# Patient Record
Sex: Male | Born: 2000 | Race: Black or African American | Hispanic: No | Marital: Single | Smoking: Current every day smoker
Health system: Southern US, Community
[De-identification: ages and names within clinical notes are randomized; demographics above are authoritative.]

## PROBLEM LIST (undated history)

## (undated) DIAGNOSIS — F909 Attention-deficit hyperactivity disorder, unspecified type: Secondary | ICD-10-CM

## (undated) HISTORY — PX: ROOT CANAL: SHX2363

---
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
## 2018-01-11 ENCOUNTER — Emergency Department (HOSPITAL_COMMUNITY): Payer: Medicaid Other

## 2018-01-11 ENCOUNTER — Emergency Department (HOSPITAL_COMMUNITY)
Admission: EM | Admit: 2018-01-11 | Discharge: 2018-01-12 | Disposition: A | Discharge status: Home / Self Care | Payer: Medicaid Other | Attending: Emergency Medicine | Admitting: Emergency Medicine

## 2018-01-11 ENCOUNTER — Encounter (HOSPITAL_COMMUNITY): Payer: Self-pay | Admitting: Emergency Medicine

## 2018-01-11 DIAGNOSIS — W25XXXA Contact with sharp glass, initial encounter: Secondary | ICD-10-CM | POA: Diagnosis not present

## 2018-01-11 DIAGNOSIS — S6992XA Unspecified injury of left wrist, hand and finger(s), initial encounter: Secondary | ICD-10-CM

## 2018-01-11 DIAGNOSIS — F17210 Nicotine dependence, cigarettes, uncomplicated: Secondary | ICD-10-CM | POA: Diagnosis not present

## 2018-01-11 DIAGNOSIS — S61213A Laceration without foreign body of left middle finger without damage to nail, initial encounter: Secondary | ICD-10-CM | POA: Insufficient documentation

## 2018-01-11 DIAGNOSIS — Y9389 Activity, other specified: Secondary | ICD-10-CM | POA: Insufficient documentation

## 2018-01-11 DIAGNOSIS — S80211A Abrasion, right knee, initial encounter: Secondary | ICD-10-CM | POA: Diagnosis not present

## 2018-01-11 DIAGNOSIS — Y92009 Unspecified place in unspecified non-institutional (private) residence as the place of occurrence of the external cause: Secondary | ICD-10-CM | POA: Insufficient documentation

## 2018-01-11 DIAGNOSIS — Y999 Unspecified external cause status: Secondary | ICD-10-CM | POA: Insufficient documentation

## 2018-01-11 NOTE — ED Triage Notes (Signed)
Pt reports punching some glass prior to arrival, reports abrasion to R knee and laceration to L posterior hand. Reports TDAP up to date last year.

## 2018-01-11 NOTE — ED Notes (Signed)
Unable to perform wound care as pt is not in room.

## 2018-01-11 NOTE — ED Provider Notes (Signed)
[facility] EMERGENCY DEPARTMENT Provider Note   CSN: [id number] Arrival date & time: 01/11/18  2150     History   Chief Complaint Chief Complaint  Patient presents with  . Finger Injury  . Leg Pain    HPI Ronnie Jenkins is a 17 y.o. male.  Patient states he punched a pane of glass with his left hand. He has a small laceration to the middle finger. Tetanus up to date. Abrasion to right knee. No knee joint pain.   Hand Injury   The incident occurred 1 to 2 hours ago. The incident occurred at home. The injury mechanism was a direct blow. The pain is mild. Possible foreign bodies include glass.    Past Medical History:  Diagnosis Date  . ADHD     There are no active problems to display for this patient.   Past Surgical History:  Procedure Laterality Date  . ROOT CANAL          Home Medications    Prior to Admission medications   Medication Sig Start Date End Date Taking? Authorizing Provider  HYDROcodone-acetaminophen (NORCO) 7.5-325 MG tablet Take 1 tablet by mouth every 6 (six) hours as needed for moderate pain. 10/31/17   Eustace Moore, MD    Family History History reviewed. No pertinent family history.  Social History Social History   Tobacco Use  . Smoking status: Current Every Day Smoker    Packs/day: 0.25    Types: Cigarettes  . Smokeless tobacco: Never Used  Substance Use Topics  . Alcohol use: Never    Frequency: Never  . Drug use: Not Currently     Allergies   Patient has no known allergies.   Review of Systems Review of Systems  Skin: Positive for wound.  All other systems reviewed and are negative.    Physical Exam Updated Vital Signs BP 134/82 (BP Location: Right Arm)   Pulse 91   Temp 99.3 F (37.4 C) (Oral)   Resp 18   Ht 6\' 1"  (1.854 m)   Wt 90.7 kg   SpO2 98%   BMI 26.39 kg/m   Physical Exam  Constitutional: He is oriented to person, place, and time. He appears well-developed and  well-nourished.  HENT:  Head: Atraumatic.  Eyes: EOM are normal.  Neck: Neck supple.  Cardiovascular: Normal rate and regular rhythm.  Pulmonary/Chest: Effort normal and breath sounds normal.  Abdominal: Soft. Bowel sounds are normal.  Musculoskeletal: Normal range of motion. He exhibits no deformity.  ? Abrasion/superficial laceration to PIP left middle finger and PIP of left little finger, obscured by blood.  Neurological: He is alert and oriented to person, place, and time.  Skin:  Abrasion right knee  Nursing note and vitals reviewed.    ED Treatments / Results  Labs (all labs ordered are listed, but only abnormal results are displayed) Labs Reviewed - No data to display  EKG None  Radiology Dg Hand Complete Left  Result Date: 01/11/2018 CLINICAL DATA:  Punched glass.  Hand laceration. EXAM: LEFT HAND - COMPLETE 3+ VIEW COMPARISON:  None. FINDINGS: There is no evidence of fracture or dislocation. There is no evidence of arthropathy or other focal bone abnormality. Bandage overlying dorsal hand. Tiny radiopaque foreign body projecting within the lateral soft tissues fifth middle phalanx. Mild dorsal finger soft tissue swelling. IMPRESSION: Tiny foreign body, possibly glass within fifth middle phalanx soft tissues. No acute osseous process. Electronically Signed   By: Michel Santee.D.  On: 01/11/2018 22:29    Procedures Procedures (including critical care time)  Medications Ordered in ED Medications - No data to display   Initial Impression / Assessment and Plan / ED Course  I have reviewed the triage vital signs and the nursing notes.  Pertinent labs & imaging results that were available during my care of the patient were reviewed by me and considered in my medical decision making (see chart for details).     Patient left prior to cleansing of wounds and assessment for need for wound closure. Patient did not inform staff prior to leaving.  Final Clinical  Impressions(s) / ED Diagnoses   Final diagnoses:  Abrasion, right knee, initial encounter  Injury of left hand, initial encounter    ED Discharge Orders    None       Felicie Morn, NP 01/12/18 0043    Virgina Norfolk, DO 01/12/18 1610

## 2018-01-12 NOTE — ED Notes (Signed)
Pt told staff that he was going out to talk to family, pt did not come back to room

## 2018-07-13 NOTE — Progress Notes (Signed)
COVID Hotel Screening performed. Temperature, PHQ-9, and need for medical care and medications assessed. No additional needs at this time.  Aby Gessel RN MSN 

## 2018-07-20 NOTE — Progress Notes (Signed)
COVID Hotel Screening performed. Temperature, PHQ-9, and need for medical care and medications assessed. No additional needs assessed at this time.  Sarahjane Matherly RN MSN 

## 2018-07-27 NOTE — Progress Notes (Signed)
COVID Hotel Screening performed. Temperature, PHQ-9, and need for medical care and medications assessed. No additional needs assessed at this time.  Malikhi Ogan RN MSN 

## 2018-08-03 NOTE — Progress Notes (Signed)
COVID Hotel Screening performed. Temperature, PHQ-9, and need for medical care and medications assessed. No additional needs assessed at this time.  Rowland Ericsson RN MSN 

## 2018-08-10 NOTE — Progress Notes (Signed)
COVID Hotel Screening performed. Temperature, PHQ-9, and need for medical care and medications assessed. No additional needs assessed at this time.  Daijha Leggio RN MSN 

## 2018-08-18 ENCOUNTER — Other Ambulatory Visit (HOSPITAL_COMMUNITY): Payer: Self-pay

## 2018-08-18 DIAGNOSIS — Z20822 Contact with and (suspected) exposure to covid-19: Secondary | ICD-10-CM

## 2018-08-19 ENCOUNTER — Other Ambulatory Visit: Payer: Self-pay

## 2018-08-19 DIAGNOSIS — Z20822 Contact with and (suspected) exposure to covid-19: Secondary | ICD-10-CM

## 2018-08-22 LAB — NOVEL CORONAVIRUS, NAA: SARS-CoV-2, NAA: NOT DETECTED

## 2018-08-22 NOTE — Progress Notes (Signed)
COVID Hotel Screening performed. COVID screening, temperature, and need for medical care and medications assessed. Patient agreed to the COVID-19 testing. No additional needs assessed at this time.  Dariona Postma RN MSN 

## 2018-09-01 NOTE — Progress Notes (Signed)
COVID Hotel Screening performed. COVID screening, temperature, PHQ-9, and need for medical care and medications assessed. No additional needs assessed at this time.  Frances Joynt RN MSN 

## 2018-09-16 NOTE — Progress Notes (Signed)
COVID Hotel Screening performed. Temperature, PHQ-9, and need for medical care and medications assessed. No additional needs assessed at this time.  Evva Din  MSN, RN 

## 2018-09-22 ENCOUNTER — Other Ambulatory Visit: Payer: Self-pay | Admitting: *Deleted

## 2018-09-22 DIAGNOSIS — Z20822 Contact with and (suspected) exposure to covid-19: Secondary | ICD-10-CM

## 2018-09-29 LAB — NOVEL CORONAVIRUS, NAA: SARS-CoV-2, NAA: NOT DETECTED

## 2018-11-02 NOTE — Progress Notes (Signed)
COVID-19 Screening performed. Temperature, PHQ-9, and need for medical care and medications assessed. No additional needs assessed at this time.  Marianne Golightly MSN, RN 

## 2018-11-06 NOTE — Progress Notes (Signed)
COVID-19 Screening performed. Temperature, PHQ-9, and need for medical care and medications assessed. No additional needs assessed at this time.  Ailanie Ruttan MSN, RN 

## 2019-06-19 ENCOUNTER — Emergency Department (HOSPITAL_COMMUNITY)
Admission: EM | Admit: 2019-06-19 | Discharge: 2019-06-19 | Disposition: A | Discharge status: Home / Self Care | Payer: Medicaid Other | Attending: Emergency Medicine | Admitting: Emergency Medicine

## 2019-06-19 ENCOUNTER — Encounter (HOSPITAL_COMMUNITY): Payer: Self-pay | Admitting: Emergency Medicine

## 2019-06-19 ENCOUNTER — Other Ambulatory Visit: Payer: Self-pay

## 2019-06-19 DIAGNOSIS — Z5321 Procedure and treatment not carried out due to patient leaving prior to being seen by health care provider: Secondary | ICD-10-CM | POA: Insufficient documentation

## 2019-06-19 DIAGNOSIS — M545 Low back pain: Secondary | ICD-10-CM | POA: Diagnosis not present

## 2019-06-19 MED ORDER — OXYCODONE-ACETAMINOPHEN 5-325 MG PO TABS
1.0000 | ORAL_TABLET | ORAL | Status: DC | PRN
  Administered 2019-06-19: 1 via ORAL
  Filled 2019-06-19: qty 1
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
## 2019-06-19 NOTE — ED Notes (Signed)
Pt ambulatory in the waiting room without assistance.

## 2019-06-22 ENCOUNTER — Encounter (HOSPITAL_COMMUNITY): Payer: Self-pay | Admitting: *Deleted

## 2019-06-22 ENCOUNTER — Other Ambulatory Visit: Payer: Self-pay

## 2019-06-22 DIAGNOSIS — Y9389 Activity, other specified: Secondary | ICD-10-CM | POA: Insufficient documentation

## 2019-06-22 DIAGNOSIS — Y998 Other external cause status: Secondary | ICD-10-CM | POA: Insufficient documentation

## 2019-06-22 DIAGNOSIS — M25551 Pain in right hip: Secondary | ICD-10-CM | POA: Diagnosis not present

## 2019-06-22 DIAGNOSIS — R200 Anesthesia of skin: Secondary | ICD-10-CM | POA: Diagnosis not present

## 2019-06-22 DIAGNOSIS — S39012A Strain of muscle, fascia and tendon of lower back, initial encounter: Secondary | ICD-10-CM | POA: Insufficient documentation

## 2019-06-22 DIAGNOSIS — S3992XA Unspecified injury of lower back, initial encounter: Secondary | ICD-10-CM | POA: Diagnosis present

## 2019-06-22 DIAGNOSIS — F17210 Nicotine dependence, cigarettes, uncomplicated: Secondary | ICD-10-CM | POA: Insufficient documentation

## 2019-06-22 DIAGNOSIS — Y92410 Unspecified street and highway as the place of occurrence of the external cause: Secondary | ICD-10-CM | POA: Insufficient documentation
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
## 2019-06-23 ENCOUNTER — Emergency Department (HOSPITAL_COMMUNITY)
Admission: EM | Admit: 2019-06-23 | Discharge: 2019-06-23 | Disposition: A | Discharge status: Home / Self Care | Payer: No Typology Code available for payment source | Attending: Emergency Medicine | Admitting: Emergency Medicine

## 2019-06-23 ENCOUNTER — Emergency Department (HOSPITAL_COMMUNITY): Payer: No Typology Code available for payment source

## 2019-06-23 DIAGNOSIS — S39012A Strain of muscle, fascia and tendon of lower back, initial encounter: Secondary | ICD-10-CM

## 2019-06-23 LAB — URINALYSIS, ROUTINE W REFLEX MICROSCOPIC
Bilirubin Urine: NEGATIVE
Glucose, UA: NEGATIVE mg/dL
Hgb urine dipstick: NEGATIVE
Ketones, ur: NEGATIVE mg/dL
Leukocytes,Ua: NEGATIVE
Nitrite: NEGATIVE
Protein, ur: NEGATIVE mg/dL
Specific Gravity, Urine: 1.03 (ref 1.005–1.030)
pH: 6 (ref 5.0–8.0)

## 2019-06-23 MED ORDER — IBUPROFEN 400 MG PO TABS
400.0000 mg | ORAL_TABLET | Freq: Once | ORAL | Status: AC
  Administered 2019-06-23: 01:00:00 400 mg via ORAL
  Filled 2019-06-23: qty 1

## 2019-06-23 MED ORDER — METHYLPREDNISOLONE 4 MG PO TBPK: ORAL_TABLET | ORAL | 0 refills | Status: DC

## 2019-06-23 MED ORDER — METHOCARBAMOL 500 MG PO TABS: 500.0000 mg | ORAL_TABLET | Freq: Three times a day (TID) | ORAL | 0 refills | Status: DC | PRN
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
## 2019-06-23 NOTE — Discharge Instructions (Signed)
Your x-rays are negative.  Take the anti-inflammatories as prescribed.  Follow-up with your doctor.  Return to the ED with worsening pain, weakness, numbness, bowel or bladder incontinence, any other concerns.

## 2019-09-14 DIAGNOSIS — Z419 Encounter for procedure for purposes other than remedying health state, unspecified: Secondary | ICD-10-CM | POA: Diagnosis not present

## 2019-10-15 DIAGNOSIS — Z419 Encounter for procedure for purposes other than remedying health state, unspecified: Secondary | ICD-10-CM | POA: Diagnosis not present

## 2019-11-15 DIAGNOSIS — Z419 Encounter for procedure for purposes other than remedying health state, unspecified: Secondary | ICD-10-CM | POA: Diagnosis not present

## 2019-12-15 DIAGNOSIS — Z419 Encounter for procedure for purposes other than remedying health state, unspecified: Secondary | ICD-10-CM | POA: Diagnosis not present

## 2020-01-15 DIAGNOSIS — Z419 Encounter for procedure for purposes other than remedying health state, unspecified: Secondary | ICD-10-CM | POA: Diagnosis not present

## 2020-02-14 DIAGNOSIS — Z419 Encounter for procedure for purposes other than remedying health state, unspecified: Secondary | ICD-10-CM | POA: Diagnosis not present

## 2020-03-16 DIAGNOSIS — Z419 Encounter for procedure for purposes other than remedying health state, unspecified: Secondary | ICD-10-CM | POA: Diagnosis not present

## 2020-04-01 ENCOUNTER — Other Ambulatory Visit: Payer: Self-pay

## 2020-04-01 ENCOUNTER — Encounter (HOSPITAL_COMMUNITY): Payer: Self-pay | Admitting: *Deleted

## 2020-04-01 ENCOUNTER — Emergency Department (HOSPITAL_COMMUNITY)
Admission: EM | Admit: 2020-04-01 | Discharge: 2020-04-02 | Disposition: A | Discharge status: Home / Self Care | Payer: Medicaid Other | Attending: Emergency Medicine | Admitting: Emergency Medicine

## 2020-04-01 DIAGNOSIS — Z5321 Procedure and treatment not carried out due to patient leaving prior to being seen by health care provider: Secondary | ICD-10-CM | POA: Diagnosis not present

## 2020-04-01 DIAGNOSIS — R45851 Suicidal ideations: Secondary | ICD-10-CM | POA: Diagnosis not present

## 2020-04-01 DIAGNOSIS — Z046 Encounter for general psychiatric examination, requested by authority: Secondary | ICD-10-CM | POA: Insufficient documentation

## 2020-04-01 LAB — CBC
HCT: 44.7 % (ref 39.0–52.0)
Hemoglobin: 15.3 g/dL (ref 13.0–17.0)
MCH: 29.3 pg (ref 26.0–34.0)
MCHC: 34.2 g/dL (ref 30.0–36.0)
MCV: 85.6 fL (ref 80.0–100.0)
Platelets: 232 10*3/uL (ref 150–400)
RBC: 5.22 MIL/uL (ref 4.22–5.81)
RDW: 12.1 % (ref 11.5–15.5)
WBC: 8.5 10*3/uL (ref 4.0–10.5)
nRBC: 0 % (ref 0.0–0.2)

## 2020-04-01 LAB — COMPREHENSIVE METABOLIC PANEL
ALT: 12 U/L (ref 0–44)
AST: 23 U/L (ref 15–41)
Albumin: 4.4 g/dL (ref 3.5–5.0)
Alkaline Phosphatase: 58 U/L (ref 38–126)
Anion gap: 11 (ref 5–15)
BUN: 11 mg/dL (ref 6–20)
CO2: 23 mmol/L (ref 22–32)
Calcium: 9.5 mg/dL (ref 8.9–10.3)
Chloride: 103 mmol/L (ref 98–111)
Creatinine, Ser: 1.12 mg/dL (ref 0.61–1.24)
GFR, Estimated: >60 mL/min (ref >60)
Glucose, Bld: 98 mg/dL (ref 70–99)
Potassium: 3.6 mmol/L (ref 3.5–5.1)
Sodium: 137 mmol/L (ref 135–145)
Total Bilirubin: 1.3 mg/dL — ABNORMAL HIGH (ref 0.3–1.2)
Total Protein: 7.8 g/dL (ref 6.5–8.1)

## 2020-04-01 LAB — RAPID URINE DRUG SCREEN, HOSP PERFORMED
Amphetamines: NOT DETECTED
Barbiturates: NOT DETECTED
Benzodiazepines: NOT DETECTED
Cocaine: NOT DETECTED
Opiates: NOT DETECTED
Tetrahydrocannabinol: POSITIVE — AB

## 2020-04-01 LAB — ACETAMINOPHEN LEVEL: Acetaminophen (Tylenol), Serum: <10 ug/mL — ABNORMAL LOW (ref 10–30)

## 2020-04-01 LAB — ETHANOL: Alcohol, Ethyl (B): <10 mg/dL (ref <10)

## 2020-04-01 LAB — SALICYLATE LEVEL: Salicylate Lvl: <7 mg/dL — ABNORMAL LOW (ref 7.0–30.0)

## 2020-04-01 NOTE — ED Triage Notes (Signed)
Pt arrived by [employer] with ivc papers. Family reported that pt was going through breakup, he was upset and trashed the house and expressed thoughts of suicide. Is calm and cooperative on arrival.

## 2020-04-01 NOTE — ED Notes (Signed)
Pt had several phone calls while in triage, pt became agitated when told to get off the phone and no more phone calls, pt roomed to hallway, pt apparently grabbed belongings and fled out the door while triage staff was busy with other patients. Staff went to check if pt had been brought back to hall bed and realized pt had left. [employer] was sitting with pt until 21:26 and then stated that they could not stay any longer with the patient.

## 2020-04-01 NOTE — ED Notes (Signed)
Patient mother would like a callback from patient, Ronnie Jenkins [phone].

## 2020-04-02 NOTE — ED Notes (Signed)
[employer] spoke with mother on phone, states that she told him to leave the hospital and states that girlfriend picked him up from the hospital but she will not disclose where he is, [employer] advised mother to tell pt it is in his best interest to come back to the hospital. [employer] states they will place a silver alert for the patient.

## 2020-04-16 DIAGNOSIS — Z419 Encounter for procedure for purposes other than remedying health state, unspecified: Secondary | ICD-10-CM | POA: Diagnosis not present

## 2020-05-14 DIAGNOSIS — Z419 Encounter for procedure for purposes other than remedying health state, unspecified: Secondary | ICD-10-CM | POA: Diagnosis not present

## 2020-06-14 DIAGNOSIS — Z419 Encounter for procedure for purposes other than remedying health state, unspecified: Secondary | ICD-10-CM | POA: Diagnosis not present

## 2020-07-14 DIAGNOSIS — Z419 Encounter for procedure for purposes other than remedying health state, unspecified: Secondary | ICD-10-CM | POA: Diagnosis not present

## 2020-08-14 DIAGNOSIS — Z419 Encounter for procedure for purposes other than remedying health state, unspecified: Secondary | ICD-10-CM | POA: Diagnosis not present

## 2020-09-13 DIAGNOSIS — Z419 Encounter for procedure for purposes other than remedying health state, unspecified: Secondary | ICD-10-CM | POA: Diagnosis not present

## 2020-10-14 DIAGNOSIS — Z419 Encounter for procedure for purposes other than remedying health state, unspecified: Secondary | ICD-10-CM | POA: Diagnosis not present

## 2020-11-14 DIAGNOSIS — Z419 Encounter for procedure for purposes other than remedying health state, unspecified: Secondary | ICD-10-CM | POA: Diagnosis not present

## 2020-12-14 DIAGNOSIS — Z419 Encounter for procedure for purposes other than remedying health state, unspecified: Secondary | ICD-10-CM | POA: Diagnosis not present

## 2021-01-14 DIAGNOSIS — Z419 Encounter for procedure for purposes other than remedying health state, unspecified: Secondary | ICD-10-CM | POA: Diagnosis not present

## 2021-02-13 DIAGNOSIS — Z419 Encounter for procedure for purposes other than remedying health state, unspecified: Secondary | ICD-10-CM | POA: Diagnosis not present

## 2021-03-16 DIAGNOSIS — Z419 Encounter for procedure for purposes other than remedying health state, unspecified: Secondary | ICD-10-CM | POA: Diagnosis not present

## 2021-03-18 ENCOUNTER — Encounter (HOSPITAL_COMMUNITY): Payer: Self-pay

## 2021-03-18 ENCOUNTER — Ambulatory Visit (HOSPITAL_COMMUNITY)
Admission: EM | Admit: 2021-03-18 | Discharge: 2021-03-18 | Disposition: A | Discharge status: Home / Self Care | Payer: Medicaid Other | Attending: Emergency Medicine | Admitting: Emergency Medicine

## 2021-03-18 ENCOUNTER — Other Ambulatory Visit: Payer: Self-pay

## 2021-03-18 DIAGNOSIS — Z5321 Procedure and treatment not carried out due to patient leaving prior to being seen by health care provider: Secondary | ICD-10-CM

## 2021-03-18 NOTE — ED Provider Notes (Signed)
Patient left prior to my evaluation   Ronnie Gong, MD 03/18/21 2151

## 2021-03-18 NOTE — ED Notes (Signed)
Pt walking down hallway towards exit to lobby. This RN asked patient if he needed something. Pt replied that he had a family emergency and has to leave. Dr Alphonzo Cruise made aware.

## 2021-03-18 NOTE — ED Triage Notes (Signed)
Patient presents to Urgent Care with complaints of SOB, chills, and headache x 2 days ago. Pt states when taking a deep breathe he has discomfort to his chest area. He was exposed to COVID x 1 week ago. Not treating symptoms.

## 2021-04-16 DIAGNOSIS — Z419 Encounter for procedure for purposes other than remedying health state, unspecified: Secondary | ICD-10-CM | POA: Diagnosis not present

## 2021-05-14 DIAGNOSIS — Z419 Encounter for procedure for purposes other than remedying health state, unspecified: Secondary | ICD-10-CM | POA: Diagnosis not present

## 2021-06-14 DIAGNOSIS — Z419 Encounter for procedure for purposes other than remedying health state, unspecified: Secondary | ICD-10-CM | POA: Diagnosis not present

## 2021-06-15 ENCOUNTER — Emergency Department (HOSPITAL_COMMUNITY)
Admission: EM | Admit: 2021-06-15 | Discharge: 2021-06-15 | Disposition: A | Discharge status: Home / Self Care | Payer: Medicaid Other | Attending: Emergency Medicine | Admitting: Emergency Medicine

## 2021-06-15 ENCOUNTER — Encounter (HOSPITAL_COMMUNITY): Payer: Self-pay | Admitting: Emergency Medicine

## 2021-06-15 ENCOUNTER — Emergency Department (HOSPITAL_COMMUNITY): Payer: Medicaid Other

## 2021-06-15 ENCOUNTER — Other Ambulatory Visit: Payer: Self-pay

## 2021-06-15 DIAGNOSIS — R1937 Generalized abdominal rigidity: Secondary | ICD-10-CM | POA: Insufficient documentation

## 2021-06-15 DIAGNOSIS — R1084 Generalized abdominal pain: Secondary | ICD-10-CM

## 2021-06-15 DIAGNOSIS — K5900 Constipation, unspecified: Secondary | ICD-10-CM | POA: Insufficient documentation

## 2021-06-15 DIAGNOSIS — R109 Unspecified abdominal pain: Secondary | ICD-10-CM | POA: Diagnosis not present

## 2021-06-15 LAB — URINALYSIS, ROUTINE W REFLEX MICROSCOPIC
Bilirubin Urine: NEGATIVE
Glucose, UA: NEGATIVE mg/dL
Hgb urine dipstick: NEGATIVE
Ketones, ur: NEGATIVE mg/dL
Leukocytes,Ua: NEGATIVE
Nitrite: NEGATIVE
Protein, ur: NEGATIVE mg/dL
Specific Gravity, Urine: 1.02 (ref 1.005–1.030)
pH: 6 (ref 5.0–8.0)

## 2021-06-15 LAB — CBC
HCT: 42.1 % (ref 39.0–52.0)
Hemoglobin: 14 g/dL (ref 13.0–17.0)
MCH: 29.4 pg (ref 26.0–34.0)
MCHC: 33.3 g/dL (ref 30.0–36.0)
MCV: 88.3 fL (ref 80.0–100.0)
Platelets: 202 10*3/uL (ref 150–400)
RBC: 4.77 MIL/uL (ref 4.22–5.81)
RDW: 12.3 % (ref 11.5–15.5)
WBC: 6.2 10*3/uL (ref 4.0–10.5)
nRBC: 0 % (ref 0.0–0.2)

## 2021-06-15 LAB — COMPREHENSIVE METABOLIC PANEL
ALT: 14 U/L (ref 0–44)
AST: 21 U/L (ref 15–41)
Albumin: 4.2 g/dL (ref 3.5–5.0)
Alkaline Phosphatase: 66 U/L (ref 38–126)
Anion gap: 7 (ref 5–15)
BUN: 10 mg/dL (ref 6–20)
CO2: 25 mmol/L (ref 22–32)
Calcium: 9.4 mg/dL (ref 8.9–10.3)
Chloride: 106 mmol/L (ref 98–111)
Creatinine, Ser: 0.99 mg/dL (ref 0.61–1.24)
GFR, Estimated: >60 mL/min (ref >60)
Glucose, Bld: 108 mg/dL — ABNORMAL HIGH (ref 70–99)
Potassium: 3.6 mmol/L (ref 3.5–5.1)
Sodium: 138 mmol/L (ref 135–145)
Total Bilirubin: 0.2 mg/dL — ABNORMAL LOW (ref 0.3–1.2)
Total Protein: 7.5 g/dL (ref 6.5–8.1)

## 2021-06-15 LAB — LIPASE, BLOOD: Lipase: 43 U/L (ref 11–51)

## 2021-06-15 MED ORDER — MORPHINE SULFATE (PF) 4 MG/ML IV SOLN
4.0000 mg | Freq: Once | INTRAVENOUS | Status: DC
  Filled 2021-06-15: qty 1

## 2021-06-15 MED ORDER — IOHEXOL 300 MG/ML  SOLN
100.0000 mL | Freq: Once | INTRAMUSCULAR | Status: AC | PRN
  Administered 2021-06-15: 100 mL via INTRAVENOUS

## 2021-06-15 MED ORDER — KETOROLAC TROMETHAMINE 15 MG/ML IJ SOLN
15.0000 mg | Freq: Once | INTRAMUSCULAR | Status: AC
  Administered 2021-06-15: 15 mg via INTRAVENOUS
  Filled 2021-06-15: qty 1

## 2021-06-15 MED ORDER — IOHEXOL 9 MG/ML PO SOLN
ORAL | Status: AC
  Filled 2021-06-15: qty 1000

## 2021-06-15 NOTE — ED Triage Notes (Addendum)
C/o generalized abd pain x 1 year with nausea, vomiting, and diarrhea.  Last smoked marijuana yesterday.  States he thinks it is related to "bad food". ?

## 2021-06-15 NOTE — ED Notes (Signed)
ED Provider at bedside. 

## 2021-06-15 NOTE — ED Notes (Signed)
Patient verbalizes understanding of discharge instructions. Opportunity for questioning and answers were provided. Armband removed by staff, pt discharged from ED.  

## 2021-06-15 NOTE — ED Notes (Signed)
Patient transported to CT 

## 2021-06-15 NOTE — Discharge Instructions (Addendum)
Please follow-up with the gastroenterology specialists if you continue to have abdominal pain.  Your most recent CT scan and lab work look good today. ?

## 2021-06-15 NOTE — ED Provider Notes (Signed)
?[facility] EMERGENCY DEPARTMENT ?Provider Note ? ? ?CSN: [id number] ?Arrival date & time: 06/15/21  0825 ? ?  ? ?History ? ?Chief Complaint  ?Patient presents with  ? Abdominal Pain  ? ? ?Ronnie Jenkins is a 21 y.o. male with no known past medical history presenting today with abdominal pain for a year.  Says that this has been going on constantly however it has worsened in the last few days.  Endorsing some constipation but denies diarrhea, nausea, vomiting.  No fevers or chills.  Does not appear to be triggered by food.  No history of abdominal surgery.  Says the abdominal pain is generalized and rates it at a 10 out of 10 saying he no longer can tolerate it.  States that laying down makes the pain even worse. ? ? ?Abdominal Pain ? ?  ? ?Home Medications ?Prior to Admission medications   ?Medication Sig Start Date End Date Taking? Authorizing Provider  ?HYDROcodone-acetaminophen (NORCO) 7.5-325 MG tablet Take 1 tablet by mouth every 6 (six) hours as needed for moderate pain. 10/31/17   Eustace Moore, MD  ?methocarbamol (ROBAXIN) 500 MG tablet Take 1 tablet (500 mg total) by mouth every 8 (eight) hours as needed for muscle spasms. 06/23/19   Rancour, Jeannett Senior, MD  ?methylPREDNISolone (MEDROL DOSEPAK) 4 MG TBPK tablet As directed 06/23/19   Glynn Octave, MD  ?   ? ?Allergies    ?Patient has no known allergies.   ? ?Review of Systems   ?Review of Systems  ?Gastrointestinal:  Positive for abdominal pain.  ?See HPI ?Physical Exam ?Updated Vital Signs ?BP (!) 136/98   Pulse 73   Temp 98 ?F (36.7 ?C) (Oral)   Resp 17   SpO2 98%  ?Physical Exam ?Vitals and nursing note reviewed.  ?Constitutional:   ?   General: He is in acute distress (Writhing in pain on stretcher).  ?   Appearance: Normal appearance. He is not ill-appearing.  ?HENT:  ?   Head: Normocephalic and atraumatic.  ?Eyes:  ?   General: No scleral icterus. ?   Conjunctiva/sclera: Conjunctivae normal.  ?Pulmonary:  ?   Effort: Pulmonary  effort is normal. No respiratory distress.  ?Abdominal:  ?   General: Bowel sounds are normal.  ?   Palpations: Abdomen is soft.  ?   Tenderness: There is generalized abdominal tenderness. There is guarding.  ?   Hernia: No hernia is present.  ?Skin: ?   General: Skin is warm and dry.  ?   Findings: No rash.  ?Neurological:  ?   Mental Status: He is alert.  ?Psychiatric:     ?   Mood and Affect: Mood normal.  ? ? ?ED Results / Procedures / Treatments   ?Labs ?(all labs ordered are listed, but only abnormal results are displayed) ?Labs Reviewed  ?COMPREHENSIVE METABOLIC PANEL - Abnormal; Notable for the following components:  ?    Result Value  ? Glucose, Bld 108 (*)   ? Total Bilirubin 0.2 (*)   ? All other components within normal limits  ?LIPASE, BLOOD  ?CBC  ?URINALYSIS, ROUTINE W REFLEX MICROSCOPIC  ? ? ?EKG ?None ? ?Radiology ?CT ABDOMEN PELVIS W CONTRAST ? ?Result Date: 06/15/2021 ?CLINICAL DATA:  Abdominal pain EXAM: CT ABDOMEN AND PELVIS WITH CONTRAST TECHNIQUE: Multidetector CT imaging of the abdomen and pelvis was performed using the standard protocol following bolus administration of intravenous contrast. RADIATION DOSE REDUCTION: This exam was performed according to the departmental dose-optimization program  which includes automated exposure control, adjustment of the mA and/or kV according to patient size and/or use of iterative reconstruction technique. CONTRAST:  OMNIPAQUE IOHEXOL 300 MG/ML  SOLN COMPARISON:  Study done earlier today FINDINGS: Lower chest: Unremarkable. Hepatobiliary: Liver measures 21 cm in length. No focal abnormality is seen. There is no dilation of bile ducts. Gallbladder is unremarkable. Pancreas: No focal abnormality is seen. Spleen: Unremarkable. Adrenals/Urinary Tract: Adrenals are not enlarged. There is no hydronephrosis. There is residual contrast in the collecting systems from the CT done earlier today. There is no perinephric fluid collection. Urinary bladder is not  distended. Stomach/Bowel: There is interval decrease in degree of small bowel dilation. Pneumatosis seen in the bowel wall in the previous study is not evident in the current study. Appendix is not dilated. There is contrast in the lumen of the appendix. There is no significant wall thickening in the colon. There is mild diffuse wall thickening in the rectum. There is no significant perirectal stranding. There is no loculated perirectal fluid collection. Vascular/Lymphatic: Unremarkable. Reproductive: Unremarkable. Other: There is no ascites or pneumoperitoneum. Musculoskeletal: Bulging of annulus is causing extrinsic pressure over the ventral margin of thecal sac and encroachment of neural foramina at L4-L5 level. There is encroachment of neural foramina at multiple levels in the lumbar spine. IMPRESSION: There is interval decrease in small bowel dilation. There is no demonstrable pneumatosis in the bowel wall. In the current study, there are no signs of intestinal obstruction or pneumoperitoneum. There is no hydronephrosis. Appendix is not dilated. There is mild diffuse wall thickening in the rectum which may be due to incomplete distention. Less likely possibility would be proctitis. There is no perirectal stranding or fluid collection. There is encroachment of neural foramina by bulging of annulus at multiple levels, more prominent at L4-L5 level. Electronically Signed   By: Ernie Avena M.D.   On: 06/15/2021 15:20  ? ?CT ABDOMEN PELVIS W CONTRAST ? ?Result Date: 06/15/2021 ?CLINICAL DATA:  Abdominal pain EXAM: CT ABDOMEN AND PELVIS WITH CONTRAST TECHNIQUE: Multidetector CT imaging of the abdomen and pelvis was performed using the standard protocol following bolus administration of intravenous contrast. RADIATION DOSE REDUCTION: This exam was performed according to the departmental dose-optimization program which includes automated exposure control, adjustment of the mA and/or kV according to patient size  and/or use of iterative reconstruction technique. CONTRAST:  OMNIPAQUE IOHEXOL 300 MG/ML  SOLN COMPARISON:  None. FINDINGS: Lower chest: No acute abnormality. Hepatobiliary: No focal liver abnormality is seen. No gallstones, gallbladder wall thickening, or biliary dilatation. Pancreas: Unremarkable. No pancreatic ductal dilatation or surrounding inflammatory changes. Spleen: Normal in size without focal abnormality. Adrenals/Urinary Tract: Normal adrenal glands. The kidneys are within normal limits. No nephrolithiasis, hydronephrosis or mass. Urinary bladder appears decompressed. Stomach/Bowel: Stomach appears normal and nondistended. The appendix is visualized and appears normal. Several loops of proximal jejunum are increased in caliber measuring up to 3.8 mm, image 47/3. The dilated bowel loops exhibit mucosal thickening and pneumatosis. The remaining small and large bowel loops grossly unremarkable. Vascular/Lymphatic: The upper abdominal vasculature appears patent. Abdominal aorta appears normal. No signs of portal venous gas. No abdominopelvic adenopathy. Reproductive: Prostate is unremarkable. Other: There is no free fluid. No signs of pneumoperitoneum. No focal fluid collections. Musculoskeletal: No acute or significant osseous findings. IMPRESSION: 1. Several loops of proximal jejunum are increased in caliber measuring up to 3.8 mm. The dilated bowel loops exhibit mucosal thickening and pneumatosis. Evaluation of the bowel loops is limited however  ever due to lack of enteric contrast material. Within this limitation, imaging findings are favored to represent enteritis. The presence of pneumatosis is a nonspecific finding and differential considerations are extensive including inflammatory/infectious etiologies acute bowel ischemia/infarction may also present with pneumatosis but this is considered far less likely. Further characterization with CT of the abdomen and pelvis with IV and oral contrast  material may be helpful. 2. No signs of pneumoperitoneum or focal fluid collections. Critical Value/emergent results were called by telephone at the time of interpretation on 06/15/2021 at 11:47 am to provider MADI

## 2021-07-14 DIAGNOSIS — Z419 Encounter for procedure for purposes other than remedying health state, unspecified: Secondary | ICD-10-CM | POA: Diagnosis not present

## 2021-08-14 DIAGNOSIS — Z419 Encounter for procedure for purposes other than remedying health state, unspecified: Secondary | ICD-10-CM | POA: Diagnosis not present

## 2021-09-13 DIAGNOSIS — Z419 Encounter for procedure for purposes other than remedying health state, unspecified: Secondary | ICD-10-CM | POA: Diagnosis not present

## 2021-10-14 DIAGNOSIS — Z419 Encounter for procedure for purposes other than remedying health state, unspecified: Secondary | ICD-10-CM | POA: Diagnosis not present

## 2021-11-14 DIAGNOSIS — Z419 Encounter for procedure for purposes other than remedying health state, unspecified: Secondary | ICD-10-CM | POA: Diagnosis not present

## 2021-12-14 DIAGNOSIS — Z419 Encounter for procedure for purposes other than remedying health state, unspecified: Secondary | ICD-10-CM | POA: Diagnosis not present

## 2022-01-14 DIAGNOSIS — Z419 Encounter for procedure for purposes other than remedying health state, unspecified: Secondary | ICD-10-CM | POA: Diagnosis not present

## 2022-02-13 DIAGNOSIS — Z419 Encounter for procedure for purposes other than remedying health state, unspecified: Secondary | ICD-10-CM | POA: Diagnosis not present

## 2022-08-05 IMAGING — CT CT ABD-PELV W/ CM
2 of 5 series · 16 of 46 positions shown, 18 images · IV contrast (Omni 300)
Comparison: None.

CLINICAL DATA: Abdominal pain

EXAM:
CT ABDOMEN AND PELVIS WITH CONTRAST
TECHNIQUE: Multidetector CT imaging of the abdomen and pelvis was performed
using the standard protocol following bolus administration of
intravenous contrast.

[Series 3: a/p w/ 5mm · axial · 0.88mm/px · z∈[+727,+1167]mm · 13 of 100 slices shown, 15 images]
[im 6/100  soft-tissue]
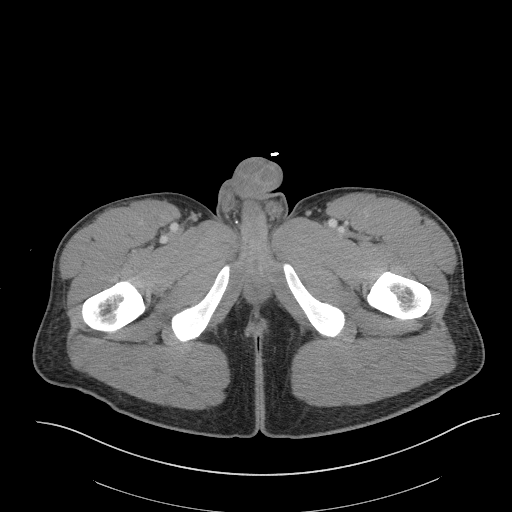
[im 6/100  bone]
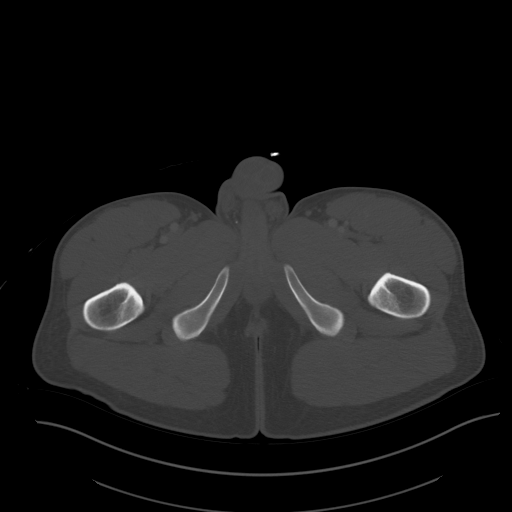
[im 12/100  soft-tissue]
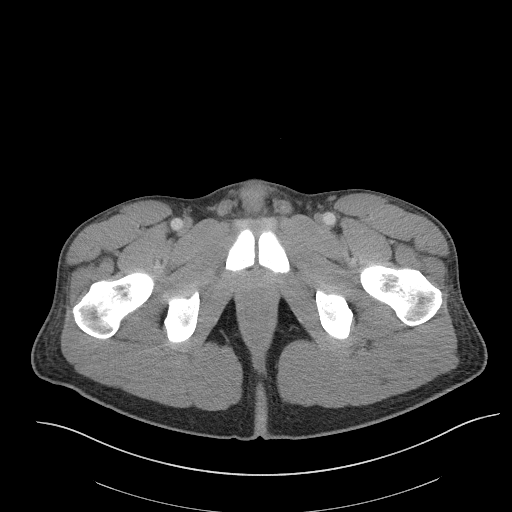
[im 23/100  soft-tissue]
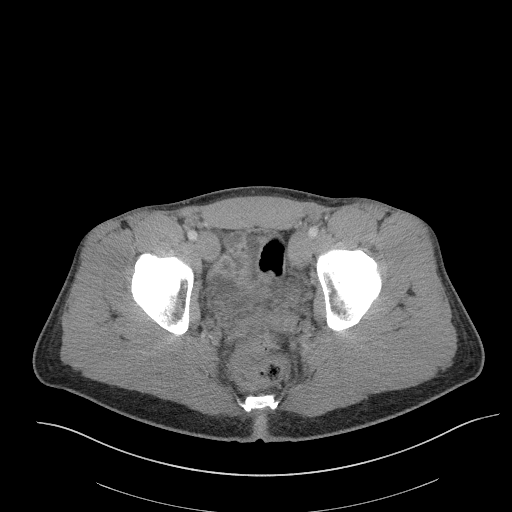
[im 28/100  soft-tissue]
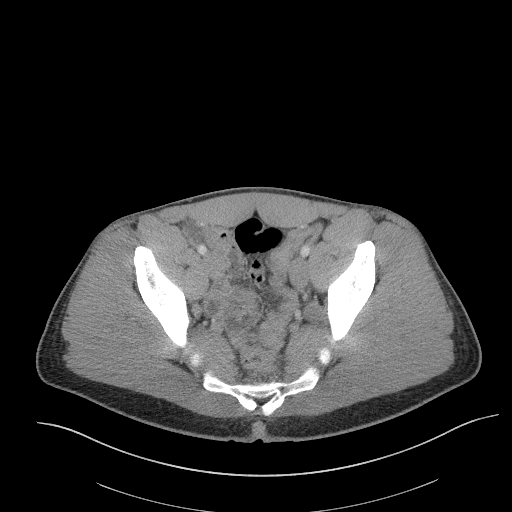
[im 34/100  soft-tissue]
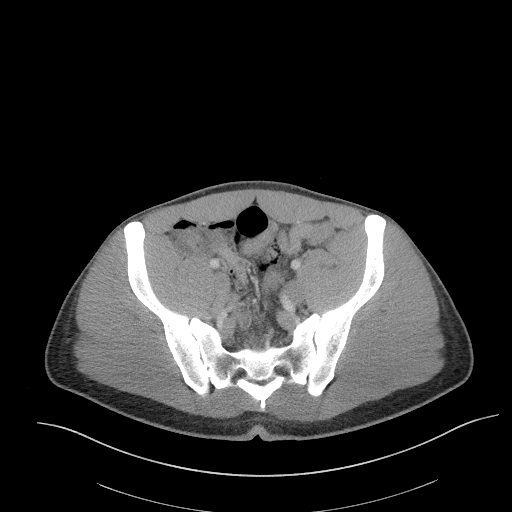
[im 45/100  soft-tissue]
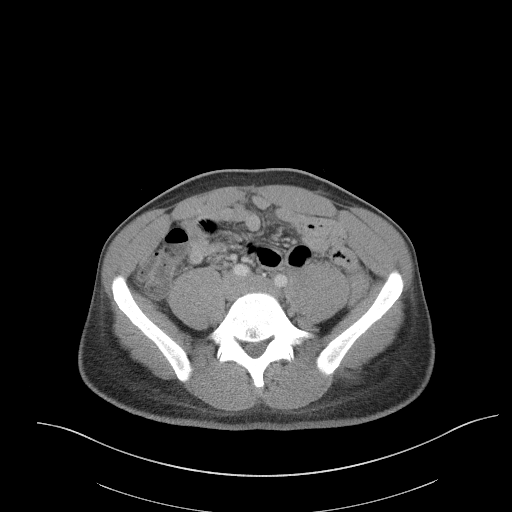
[im 50/100  soft-tissue]
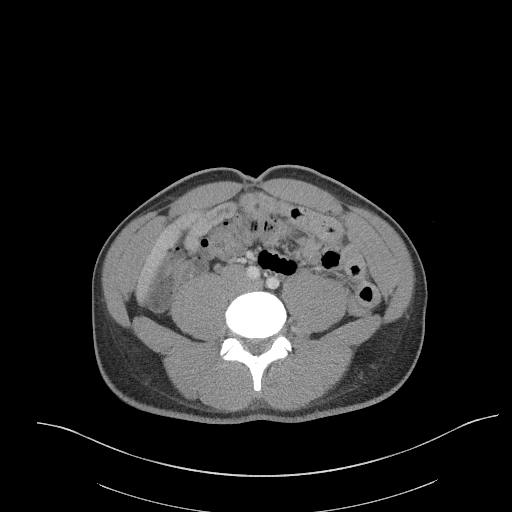
[im 56/100  soft-tissue]
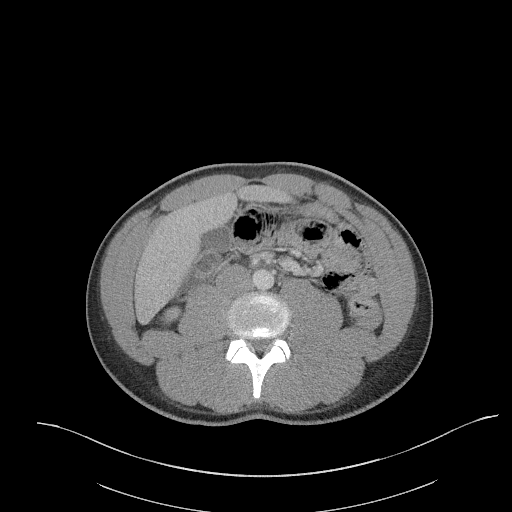
[im 67/100  soft-tissue]
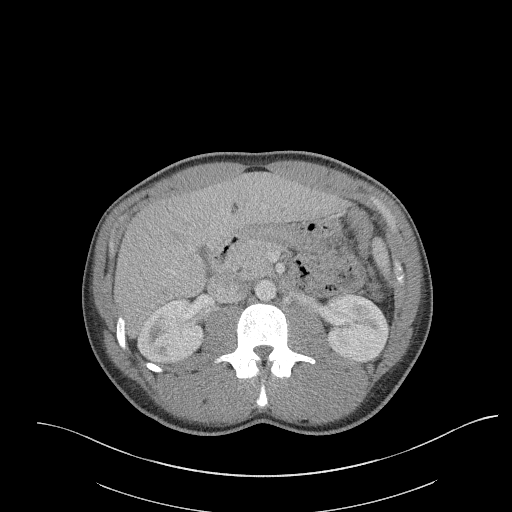
[im 67/100  bone]
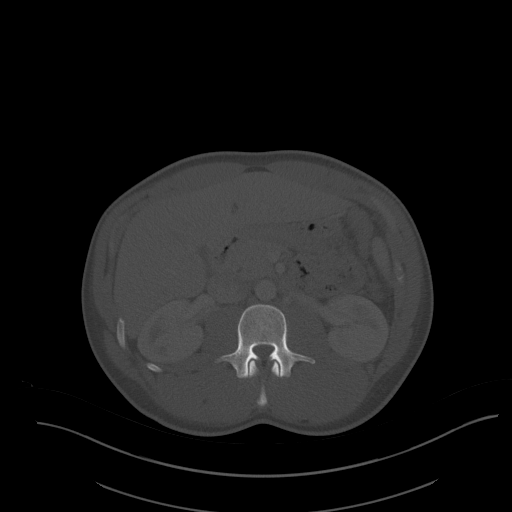
[im 72/100  soft-tissue]
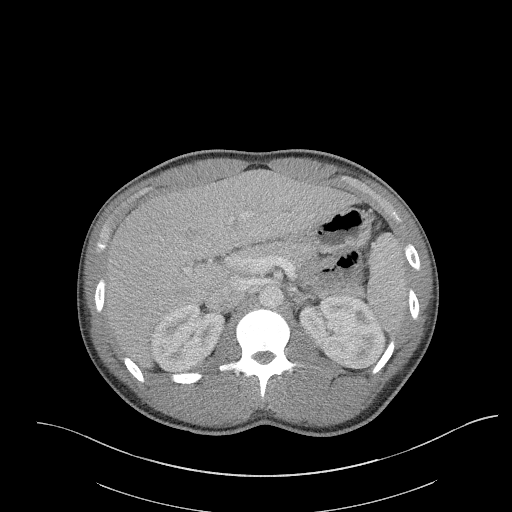
[im 78/100  soft-tissue]
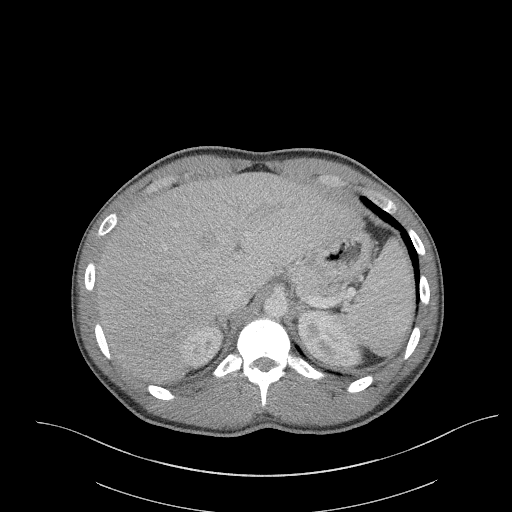
[im 89/100  soft-tissue]
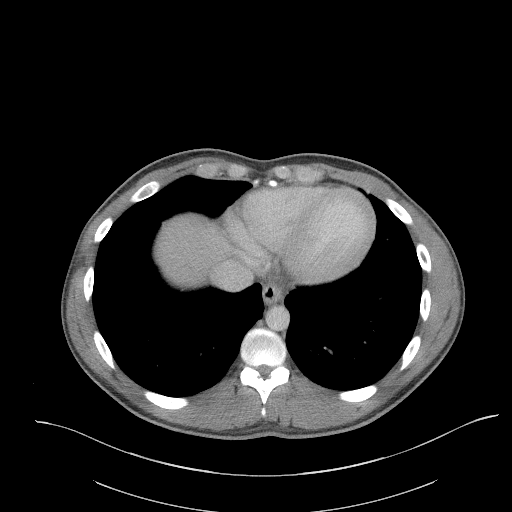
[im 94/100  soft-tissue]
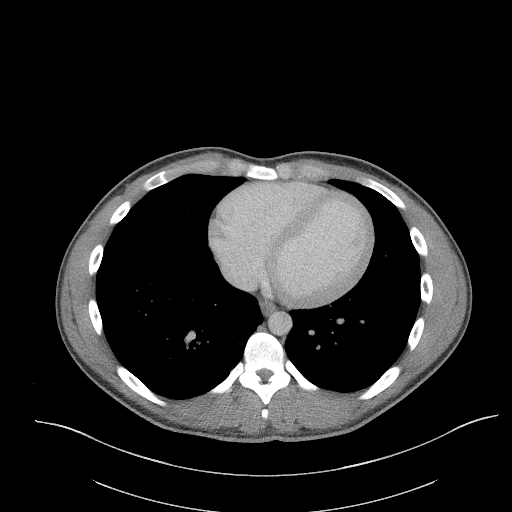

[Series 6: a/p w/ cor · coronal · 0.86mm/px · 3 of 149 slices shown]
[im 50/149  soft-tissue]
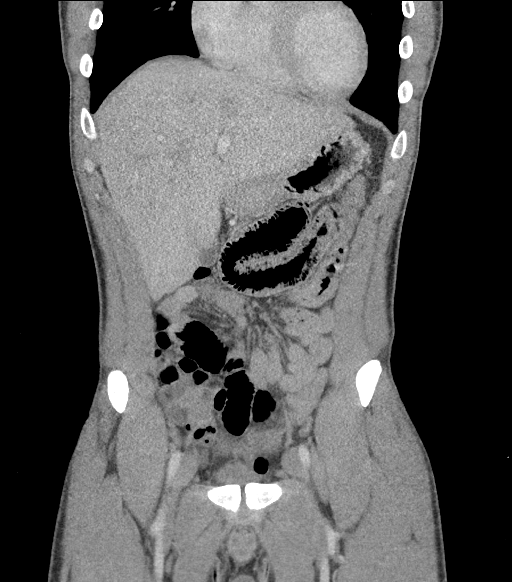
[im 66/149  soft-tissue]
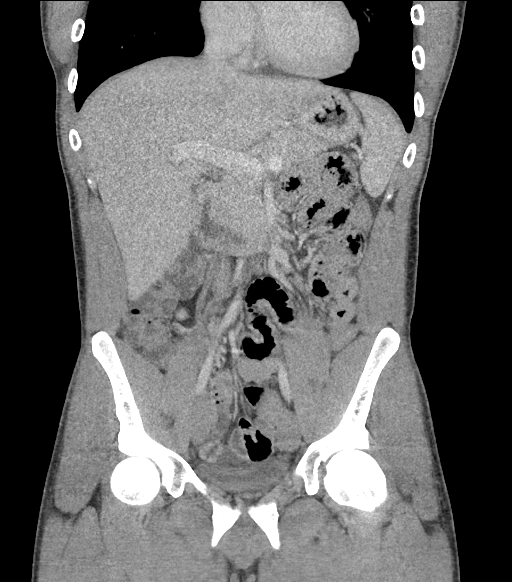
[im 83/149  soft-tissue]
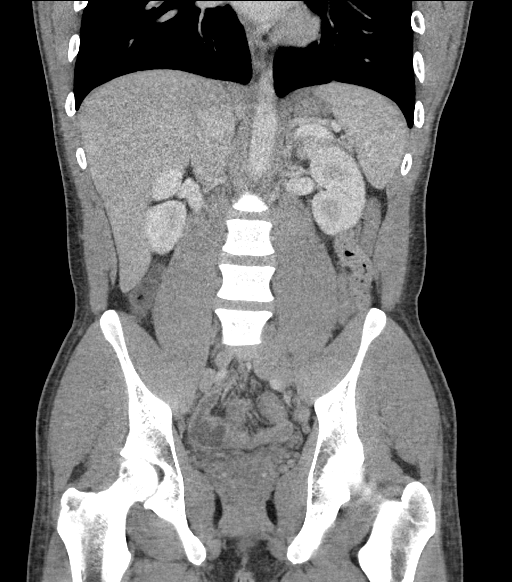

[16 of 46 positions shown; findings below may reference images not displayed]

RADIATION DOSE REDUCTION: This exam was performed according to the
departmental dose-optimization program which includes automated
exposure control, adjustment of the mA and/or kV according to
patient size and/or use of iterative reconstruction technique.

CONTRAST:  100mL OMNIPAQUE IOHEXOL 300 MG/ML  SOLN
FINDINGS: Lower chest: No acute abnormality.

Hepatobiliary: No focal liver abnormality is seen. No gallstones,
gallbladder wall thickening, or biliary dilatation.

Pancreas: Unremarkable. No pancreatic ductal dilatation or
surrounding inflammatory changes.

Spleen: Normal in size without focal abnormality.

Adrenals/Urinary Tract: Normal adrenal glands. The kidneys are
within normal limits. No nephrolithiasis, hydronephrosis or mass.
Urinary bladder appears decompressed.

Stomach/Bowel: Stomach appears normal and nondistended. The appendix
is visualized and appears normal. Several loops of proximal jejunum
are increased in caliber measuring up to 3.8 mm, image 47/3. The
dilated bowel loops exhibit mucosal thickening and pneumatosis. The
remaining small and large bowel loops grossly unremarkable.

Vascular/Lymphatic: The upper abdominal vasculature appears patent.
Abdominal aorta appears normal. No signs of portal venous gas. No
abdominopelvic adenopathy.

Reproductive: Prostate is unremarkable.

Other: There is no free fluid. No signs of pneumoperitoneum. No
focal fluid collections.

Musculoskeletal: No acute or significant osseous findings.
IMPRESSION: 1. Several loops of proximal jejunum are increased in caliber
measuring up to 3.8 mm. The dilated bowel loops exhibit mucosal
thickening and pneumatosis. Evaluation of the bowel loops is limited
however ever due to lack of enteric contrast material. Within this
limitation, imaging findings are favored to represent enteritis. The
presence of pneumatosis is a nonspecific finding and differential
considerations are extensive including inflammatory/infectious
etiologies acute bowel ischemia/infarction may also present with
pneumatosis but this is considered far less likely. Further
characterization with CT of the abdomen and pelvis with IV and oral
contrast material may be helpful.
2. No signs of pneumoperitoneum or focal fluid collections.

Critical Value/emergent results were called by telephone at the time
of interpretation on 06/15/2021 at [DATE] to provider [HOSPITAL]
KRUPA , who verbally acknowledged these results.

## 2022-08-05 IMAGING — CT CT ABD-PELV W/ CM
2 of 6 series · 16 of 46 positions shown, 18 images · IV contrast (Omni 300)
Comparison: Study done earlier today

CLINICAL DATA: Abdominal pain

EXAM:
CT ABDOMEN AND PELVIS WITH CONTRAST
TECHNIQUE: Multidetector CT imaging of the abdomen and pelvis was performed
using the standard protocol following bolus administration of
intravenous contrast.

[Series 3: a/p w/ 5mm · axial · 0.86mm/px · z∈[+800,+1220]mm · 13 of 100 slices shown, 15 images]
[im 8/100  soft-tissue]
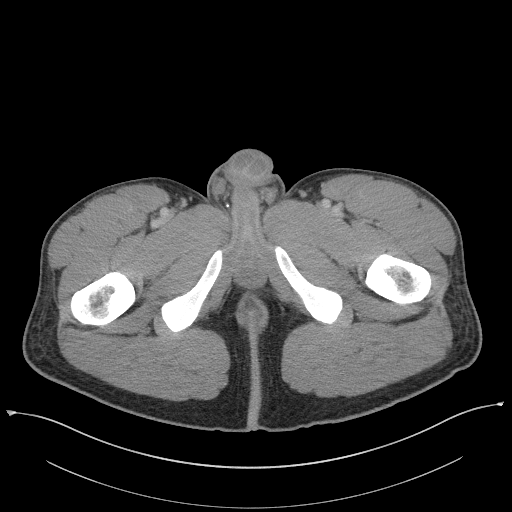
[im 8/100  bone]
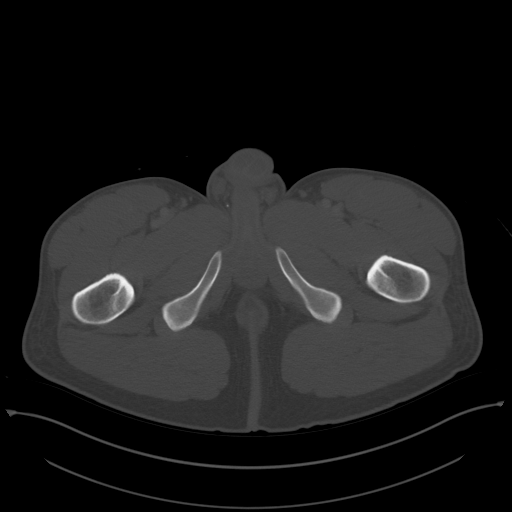
[im 15/100  soft-tissue]
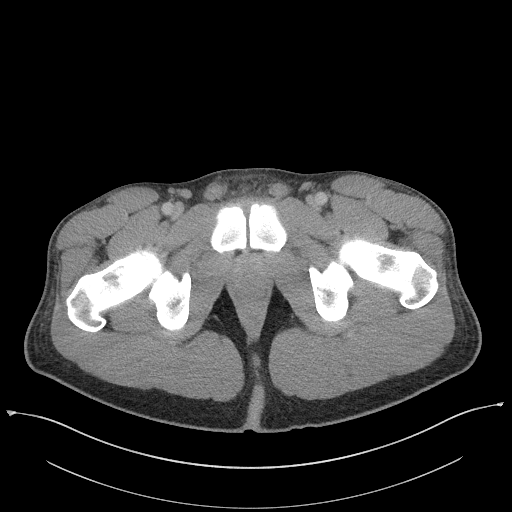
[im 22/100  soft-tissue]
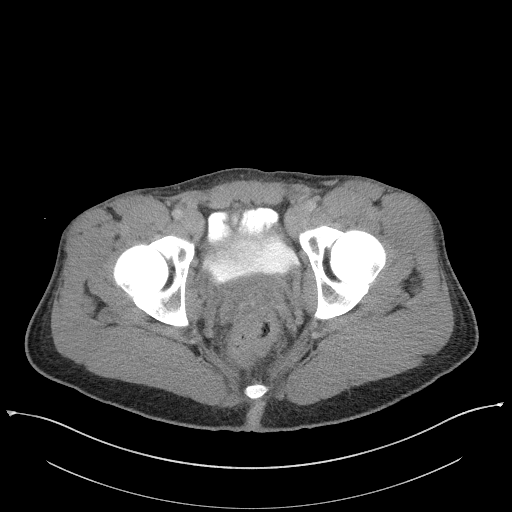
[im 29/100  soft-tissue]
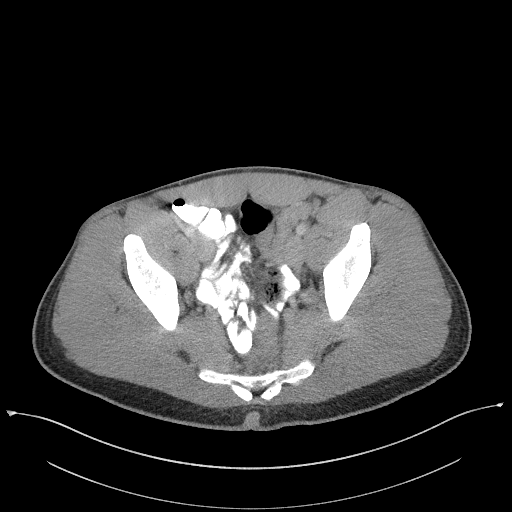
[im 36/100  soft-tissue]
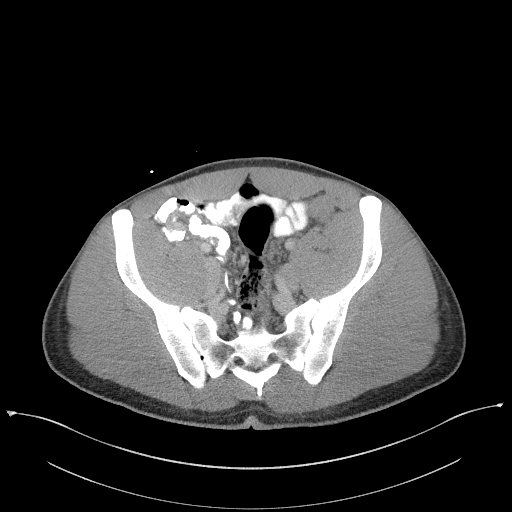
[im 43/100  soft-tissue]
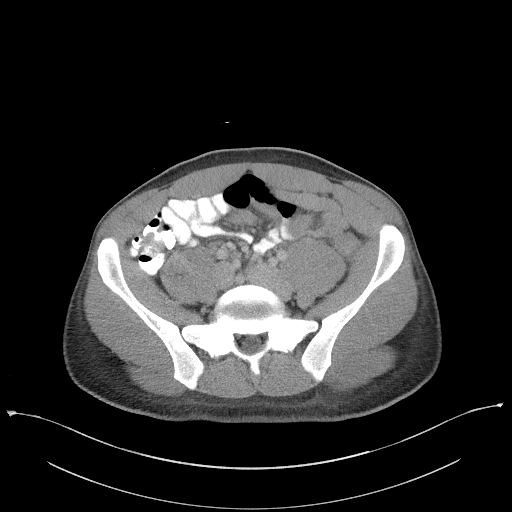
[im 50/100  soft-tissue]
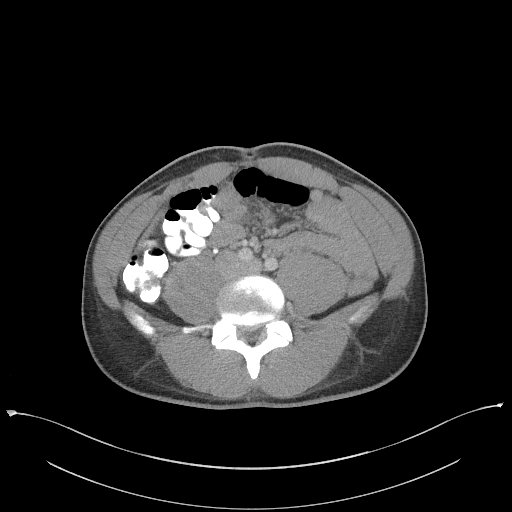
[im 57/100  soft-tissue]
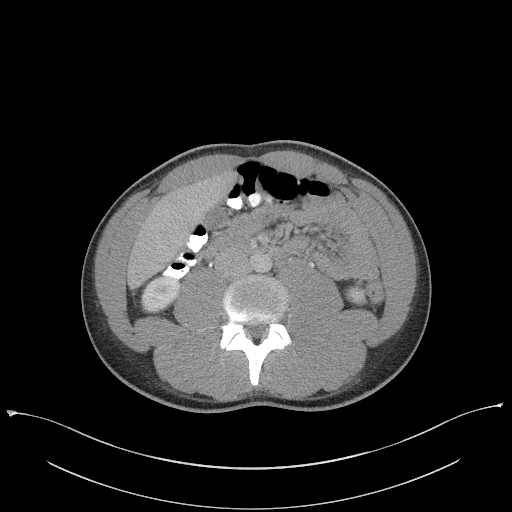
[im 64/100  soft-tissue]
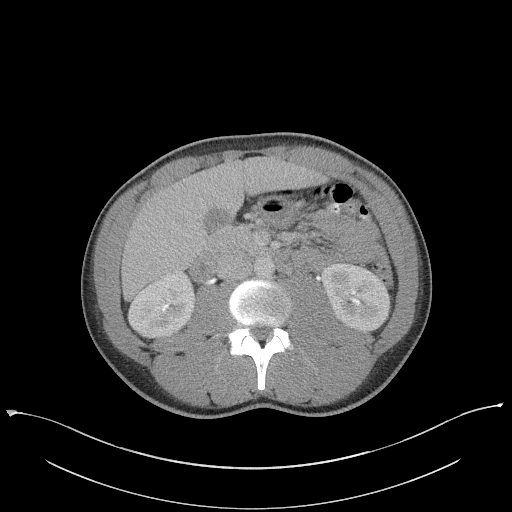
[im 64/100  bone]
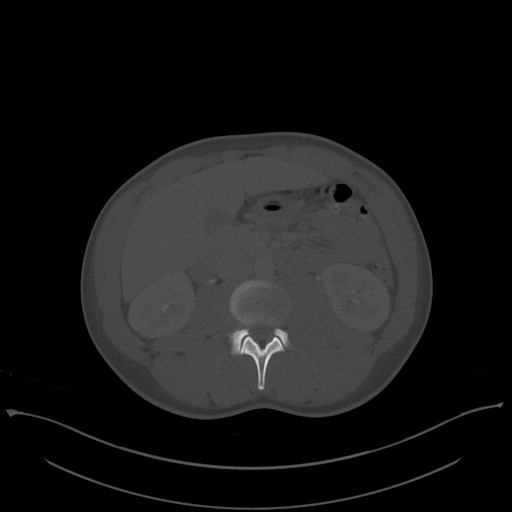
[im 71/100  soft-tissue]
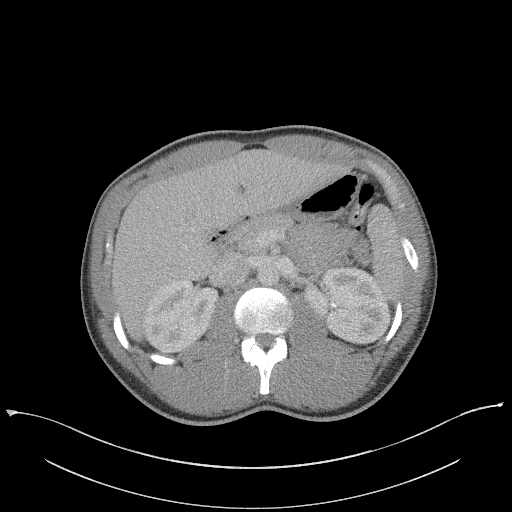
[im 78/100  soft-tissue]
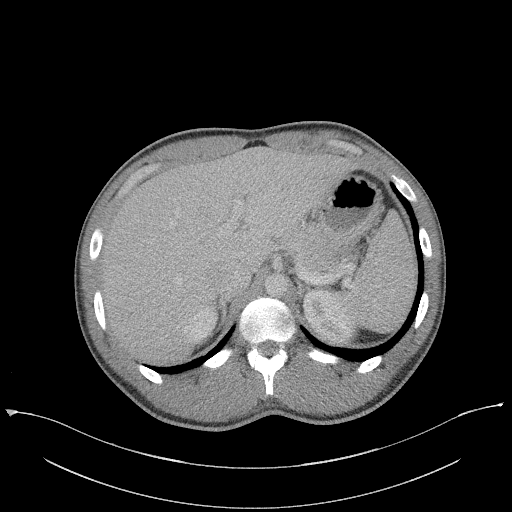
[im 85/100  soft-tissue]
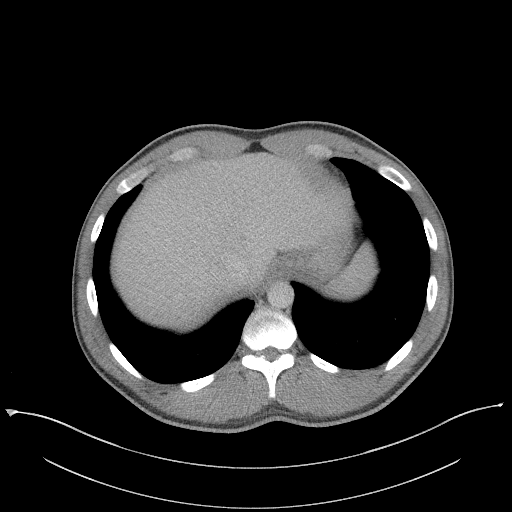
[im 92/100  soft-tissue]
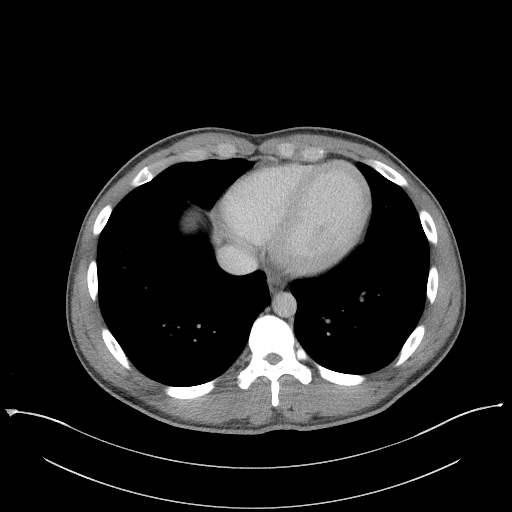

[Series 5: a/p w/ cor · coronal · 0.85mm/px · 3 of 149 slices shown]
[im 50/149  soft-tissue]
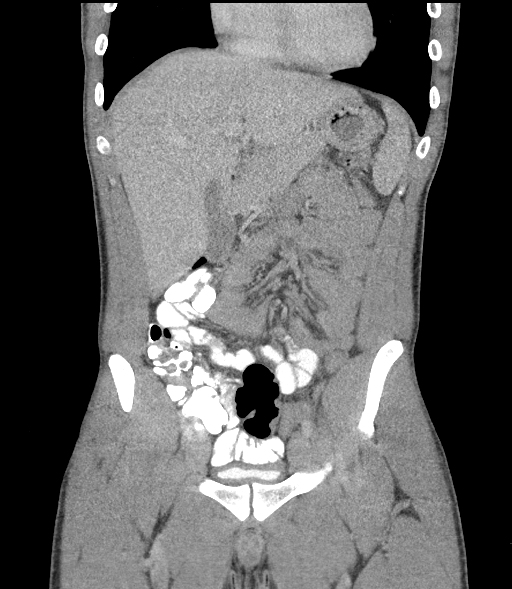
[im 66/149  soft-tissue]
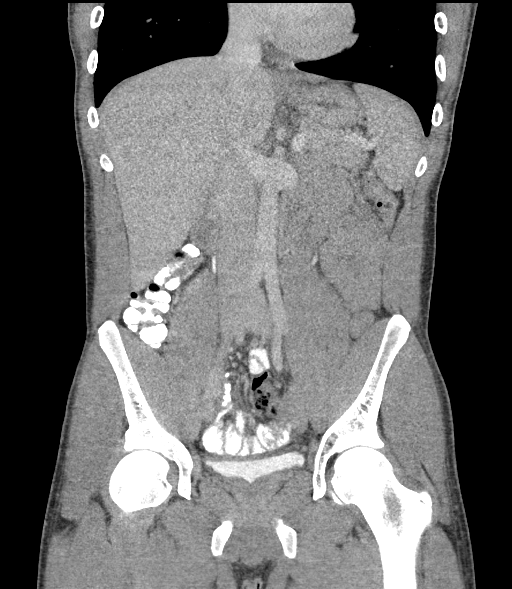
[im 83/149  soft-tissue]
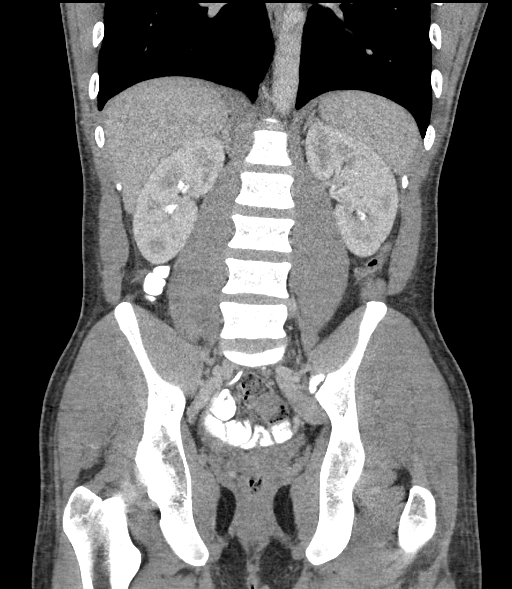

[16 of 46 positions shown; findings below may reference images not displayed]

RADIATION DOSE REDUCTION: This exam was performed according to the
departmental dose-optimization program which includes automated
exposure control, adjustment of the mA and/or kV according to
patient size and/or use of iterative reconstruction technique.

CONTRAST:  100mL OMNIPAQUE IOHEXOL 300 MG/ML  SOLN
FINDINGS: Lower chest: Unremarkable.

Hepatobiliary: Liver measures 21 cm in length. No focal abnormality
is seen. There is no dilation of bile ducts. Gallbladder is
unremarkable.

Pancreas: No focal abnormality is seen.

Spleen: Unremarkable.

Adrenals/Urinary Tract: Adrenals are not enlarged. There is no
hydronephrosis. There is residual contrast in the collecting systems
from the CT done earlier today. There is no perinephric fluid
collection. Urinary bladder is not distended.

Stomach/Bowel: There is interval decrease in degree of small bowel
dilation. Pneumatosis seen in the bowel wall in the previous study
is not evident in the current study. Appendix is not dilated. There
is contrast in the lumen of the appendix. There is no significant
wall thickening in the colon. There is mild diffuse wall thickening
in the rectum. There is no significant perirectal stranding. There
is no loculated perirectal fluid collection.

Vascular/Lymphatic: Unremarkable.

Reproductive: Unremarkable.

Other: There is no ascites or pneumoperitoneum.

Musculoskeletal: Bulging of annulus is causing extrinsic pressure
over the ventral margin of thecal sac and encroachment of neural
foramina at L4-L5 level. There is encroachment of neural foramina at
multiple levels in the lumbar spine.
IMPRESSION: There is interval decrease in small bowel dilation. There is no
demonstrable pneumatosis in the bowel wall.

In the current study, there are no signs of intestinal obstruction
or pneumoperitoneum. There is no hydronephrosis. Appendix is not
dilated.

There is mild diffuse wall thickening in the rectum which may be due
to incomplete distention. Less likely possibility would be
proctitis. There is no perirectal stranding or fluid collection.

There is encroachment of neural foramina by bulging of annulus at
multiple levels, more prominent at L4-L5 level.

## 2023-05-18 ENCOUNTER — Encounter (HOSPITAL_COMMUNITY): Payer: Self-pay

## 2023-05-18 ENCOUNTER — Other Ambulatory Visit: Payer: Self-pay

## 2023-05-18 ENCOUNTER — Emergency Department (HOSPITAL_COMMUNITY): Payer: Self-pay

## 2023-05-18 ENCOUNTER — Emergency Department (HOSPITAL_COMMUNITY)
Admission: EM | Admit: 2023-05-18 | Discharge: 2023-05-18 | Discharge status: Against Medical Advice | Payer: Self-pay | Attending: Emergency Medicine | Admitting: Emergency Medicine

## 2023-05-18 DIAGNOSIS — M7021 Olecranon bursitis, right elbow: Secondary | ICD-10-CM | POA: Insufficient documentation

## 2023-05-18 DIAGNOSIS — Y9389 Activity, other specified: Secondary | ICD-10-CM | POA: Insufficient documentation

## 2023-05-18 DIAGNOSIS — Z5321 Procedure and treatment not carried out due to patient leaving prior to being seen by health care provider: Secondary | ICD-10-CM | POA: Insufficient documentation

## 2023-05-18 MED ORDER — OXYCODONE-ACETAMINOPHEN 5-325 MG PO TABS
1.0000 | ORAL_TABLET | Freq: Once | ORAL | Status: AC
  Administered 2023-05-18: 1 via ORAL
  Filled 2023-05-18: qty 1

## 2023-05-18 MED ORDER — LIDOCAINE-EPINEPHRINE-TETRACAINE (LET) TOPICAL GEL
3.0000 mL | Freq: Once | TOPICAL | Status: AC
  Administered 2023-05-18: 3 mL via TOPICAL
  Filled 2023-05-18: qty 3

## 2023-05-18 NOTE — ED Provider Triage Note (Signed)
 Emergency Medicine Provider Triage Evaluation Note  LEILAND MIHELICH , a 23 y.o. male  was evaluated in triage.  Pt complains of arm injury. Patient reports that he was at work and hit his elbow against a trailer. Denies falls. Unable to move the elbow well.  Review of Systems  Positive: As above Negative: As above  Physical Exam  BP (!) 132/94 (BP Location: Right Arm)   Pulse 90   Temp 99.3 F (37.4 C) (Oral)   Resp 16   SpO2 100%  Gen:   Awake, no distress   Resp:  Normal effort  MSK:   Holding arm at side, notable area of swelling at the elbow. Feels firm with no appreciable fluctuance. No erythema in the elbow. Neurovascularly intact. Other:    Medical Decision Making  Medically screening exam initiated at 4:35 PM.  Appropriate orders placed.  TALLEY KREISER was informed that the remainder of the evaluation will be completed by another provider, this initial triage assessment does not replace that evaluation, and the importance of remaining in the ED until their evaluation is complete.     Smitty Knudsen, PA-C 05/18/23 [phone]

## 2023-05-18 NOTE — ED Notes (Signed)
 Pt. Walked out stating that is was taking to long to be discharged. Pt. Left w/o discharge papers.

## 2023-05-18 NOTE — ED Triage Notes (Signed)
 Pt reports hitting his right elbow on a trailer at work. Pt has swelling and pain.

## 2023-05-18 NOTE — Discharge Instructions (Signed)
 Ice the area of pain. Take the anti-inflammatory medicines prescribed.  Follow up with the Orthopedic doctor if the pain or swelling continues. Xrays do not show any fracture.

## 2023-09-19 ENCOUNTER — Encounter (HOSPITAL_COMMUNITY): Payer: Self-pay

## 2023-09-19 ENCOUNTER — Other Ambulatory Visit: Payer: Self-pay

## 2023-09-19 ENCOUNTER — Emergency Department (HOSPITAL_COMMUNITY)
Admission: EM | Admit: 2023-09-19 | Discharge: 2023-09-19 | Disposition: A | Discharge status: Home / Self Care | Payer: Self-pay | Attending: Emergency Medicine | Admitting: Emergency Medicine

## 2023-09-19 DIAGNOSIS — Z23 Encounter for immunization: Secondary | ICD-10-CM | POA: Insufficient documentation

## 2023-09-19 DIAGNOSIS — S80862A Insect bite (nonvenomous), left lower leg, initial encounter: Secondary | ICD-10-CM | POA: Insufficient documentation

## 2023-09-19 DIAGNOSIS — W57XXXA Bitten or stung by nonvenomous insect and other nonvenomous arthropods, initial encounter: Secondary | ICD-10-CM | POA: Insufficient documentation

## 2023-09-19 LAB — I-STAT CG4 LACTIC ACID, ED: Lactic Acid, Venous: 0.8 mmol/L (ref 0.5–1.9)

## 2023-09-19 MED ORDER — DOXYCYCLINE HYCLATE 100 MG PO CAPS: 100.0000 mg | ORAL_CAPSULE | Freq: Two times a day (BID) | ORAL | 0 refills | Status: DC

## 2023-09-19 MED ORDER — TETANUS-DIPHTH-ACELL PERTUSSIS 5-2.5-18.5 LF-MCG/0.5 IM SUSY
0.5000 mL | PREFILLED_SYRINGE | Freq: Once | INTRAMUSCULAR | Status: AC
  Administered 2023-09-19: 0.5 mL via INTRAMUSCULAR
  Filled 2023-09-19: qty 0.5

## 2023-09-19 MED ORDER — DOXYCYCLINE HYCLATE 100 MG PO TABS
100.0000 mg | ORAL_TABLET | Freq: Once | ORAL | Status: AC
  Administered 2023-09-19: 100 mg via ORAL
  Filled 2023-09-19: qty 1

## 2023-09-19 NOTE — Discharge Instructions (Addendum)
 You likely do have an insect bite.  While you are in the emergency room, you received a tetanus shot.  We also sent your prescription for doxycycline .  This is an antibiotic.  Symptoms should begin to improve over the next few days.  Return to the emergency room if you start to notice increasing swelling in the area, worsening pain, fever.  Follow-up with your primary care doctor.

## 2023-09-19 NOTE — ED Triage Notes (Signed)
 Pt complaining of pain on the medial side of his ankle from 2 spots where it looks like he was bitten. The area is warm to the touch and is red around what looks like bite marks. Said he noticed them yesterday and they have gotten bigger.

## 2023-09-19 NOTE — ED Provider Notes (Signed)
  [name] [facility] DEPARTMENT AT [facility] Provider Note   CSN: [id number] Arrival date & time: 09/19/23  1927     Patient presents with: Insect Bite   Ronnie Jenkins is a 23 y.o. male.   23 year old male here today because he has some pain on the middle of his left ankle.  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  Noticed yesterday, feels its gotten bigger over the last 1 day.  Otherwise healthy.        Prior to Admission medications   Medication Sig Start Date End Date Taking? Authorizing Provider  HYDROcodone -acetaminophen  (NORCO) 7.5-325 MG tablet Take 1 tablet by mouth every 6 (six) hours as needed for moderate pain. 10/31/17   Maranda Jamee Jacob, MD  methocarbamol  (ROBAXIN ) 500 MG tablet Take 1 tablet (500 mg total) by mouth every 8 (eight) hours as needed for muscle spasms. 06/23/19   Rancour, Garnette, MD  methylPREDNISolone  (MEDROL  DOSEPAK) 4 MG TBPK tablet As directed 06/23/19   Carita Garnette, MD    Allergies: Patient has no known allergies.    Review of Systems  Updated Vital Signs BP (!) 142/89 (BP Location: Left Arm)   Pulse 92   Temp 98.3 F (36.8 C) (Oral)   Resp 19   Ht 6' 4 (1.93 m)   Wt 106.6 kg   SpO2 100%   BMI 28.61 kg/m   Physical Exam Vitals and nursing note reviewed.  Skin:    General: Skin is warm.     Comments: On the medial left lower extremity, there is 1 small ulceration with some surrounding erythema at the medial malleolus, proximally at the mid tibia, there is a second similarly sized lesion.  No surrounding crepitus, no pus, no purulence.     (all labs ordered are listed, but only abnormal results are displayed) Labs Reviewed  I-STAT CG4 LACTIC ACID, ED    EKG: None  Radiology: No results found.   Procedures   Medications Ordered in the ED  doxycycline  (VIBRA -TABS) tablet 100 mg (has no administration in time range)  Tdap (BOOSTRIX ) injection 0.5 mL (has no administration in  time range)                                    Medical Decision Making 23 year old male here today with 2 warm and irritated areas on his lower extremity.  Plan-based on the appearance of these wounds, the patient's occupation, suspicion is this is likely an insect bite or sting.  Patient is unclear if he may have had 1.  Patient certainly has exposure to insects.  There is some mild surrounding cellulitis, patient would benefit from doxycycline .  No evidence of necrotizing soft tissue infection.  Possibly brown recluse bite.  Will provide Tdap.  Will order doxycycline .  I will the patient follow-up with his PCP.  Return precaution discussed with patient at bedside.  Risk Prescription drug management.        Final diagnoses:  Insect bite of left lower leg, initial encounter    ED Discharge Orders     None          Mannie Fairy DASEN, DO 09/19/23 2105

## 2023-10-11 ENCOUNTER — Ambulatory Visit (HOSPITAL_COMMUNITY)
Admission: EM | Admit: 2023-10-11 | Discharge: 2023-10-11 | Disposition: A | Discharge status: Home / Self Care | Payer: Self-pay | Attending: Internal Medicine | Admitting: Internal Medicine

## 2023-10-11 ENCOUNTER — Encounter (HOSPITAL_COMMUNITY): Payer: Self-pay

## 2023-10-11 DIAGNOSIS — K0889 Other specified disorders of teeth and supporting structures: Secondary | ICD-10-CM

## 2023-10-11 DIAGNOSIS — K047 Periapical abscess without sinus: Secondary | ICD-10-CM

## 2023-10-11 MED ORDER — HYDROCODONE-ACETAMINOPHEN 5-325 MG PO TABS
1.0000 | ORAL_TABLET | ORAL | 0 refills | Status: DC | PRN
Start: 2023-10-11 — End: 2024-02-04

## 2023-10-11 MED ORDER — AMOXICILLIN-POT CLAVULANATE 875-125 MG PO TABS: 1.0000 | ORAL_TABLET | Freq: Two times a day (BID) | ORAL | 0 refills | Status: AC

## 2023-10-11 NOTE — ED Provider Notes (Signed)
 [facility]    CSN: [id number] Arrival date & time: 10/11/23  1051      History   Chief Complaint Chief Complaint  Patient presents with   Dental Pain    HPI Ronnie Jenkins is a 23 y.o. male.   23 year old male who is seen in urgent care secondary to left upper jaw pain and swelling.  This has been going on for several days now.  It is getting worse.  He is unable to eat due to the pain.  He cannot sleep.  He has an appointment with his dentist on August 13.  He denies any fevers or chills.  He does have several fractured molars on that side that he has known for quite some time needed to be removed.   Dental Pain Associated symptoms: no fever     Past Medical History:  Diagnosis Date   ADHD     There are no active problems to display for this patient.   Past Surgical History:  Procedure Laterality Date   ROOT CANAL         Home Medications    Prior to Admission medications   Medication Sig Start Date End Date Taking? Authorizing Provider  amoxicillin -clavulanate (AUGMENTIN ) 875-125 MG tablet Take 1 tablet by mouth every 12 (twelve) hours for 10 days. 10/11/23 10/21/23 Yes Gracy Ehly A, PA-C  HYDROcodone -acetaminophen  (NORCO/VICODIN) 5-325 MG tablet Take 1-2 tablets by mouth every 4 (four) hours as needed for severe pain (pain score 7-10). 10/11/23  Yes Chanel Mcadams A, PA-C  doxycycline  (VIBRAMYCIN ) 100 MG capsule Take 1 capsule (100 mg total) by mouth 2 (two) times daily. 09/19/23   Mannie Fairy DASEN, DO  methocarbamol  (ROBAXIN ) 500 MG tablet Take 1 tablet (500 mg total) by mouth every 8 (eight) hours as needed for muscle spasms. 06/23/19   Rancour, Garnette, MD  methylPREDNISolone  (MEDROL  DOSEPAK) 4 MG TBPK tablet As directed 06/23/19   Carita Garnette, MD    Family History History reviewed. No pertinent family history.  Social History Social History   Tobacco Use   Smoking status: Every Day    Current packs/day: 0.25    Types: Cigarettes    Smokeless tobacco: Never  Substance Use Topics   Alcohol use: Yes   Drug use: Yes    Types: Marijuana     Allergies   Patient has no known allergies.   Review of Systems Review of Systems  Constitutional:  Negative for chills and fever.  HENT:  Positive for dental problem (left upper jaw pain and swelling). Negative for ear pain and sore throat.   Eyes:  Negative for pain and visual disturbance.  Respiratory:  Negative for cough and shortness of breath.   Cardiovascular:  Negative for chest pain and palpitations.  Gastrointestinal:  Negative for abdominal pain and vomiting.  Genitourinary:  Negative for dysuria and hematuria.  Musculoskeletal:  Negative for arthralgias and back pain.  Skin:  Negative for color change and rash.  Neurological:  Negative for seizures and syncope.  All other systems reviewed and are negative.    Physical Exam Triage Vital Signs ED Triage Vitals [10/11/23 1139]  Encounter Vitals Group     BP (!) 137/95     Girls Systolic BP Percentile      Girls Diastolic BP Percentile      Boys Systolic BP Percentile      Boys Diastolic BP Percentile      Pulse Rate 93     Resp 18  Temp 98.5 F (36.9 C)     Temp Source Oral     SpO2 96 %     Weight      Height      Head Circumference      Peak Flow      Pain Score      Pain Loc      Pain Education      Exclude from Growth Chart    No data found.  Updated Vital Signs BP (!) 137/95 (BP Location: Left Arm)   Pulse 93   Temp 98.5 F (36.9 C) (Oral)   Resp 18   SpO2 96%   Visual Acuity Right Eye Distance:   Left Eye Distance:   Bilateral Distance:    Right Eye Near:   Left Eye Near:    Bilateral Near:     Physical Exam Vitals and nursing note reviewed.  Constitutional:      General: He is not in acute distress.    Appearance: He is well-developed.  HENT:     Head: Normocephalic and atraumatic.     Mouth/Throat:   Eyes:     Conjunctiva/sclera: Conjunctivae normal.   Cardiovascular:     Rate and Rhythm: Normal rate and regular rhythm.     Heart sounds: No murmur heard. Pulmonary:     Effort: Pulmonary effort is normal. No respiratory distress.     Breath sounds: Normal breath sounds.  Abdominal:     Palpations: Abdomen is soft.     Tenderness: There is no abdominal tenderness.  Musculoskeletal:        General: No swelling.     Cervical back: Neck supple.  Skin:    General: Skin is warm and dry.     Capillary Refill: Capillary refill takes less than 2 seconds.  Neurological:     Mental Status: He is alert.  Psychiatric:        Mood and Affect: Mood normal.      UC Treatments / Results  Labs (all labs ordered are listed, but only abnormal results are displayed) Labs Reviewed - No data to display  EKG   Radiology No results found.  Procedures Procedures (including critical care time)  Medications Ordered in UC Medications - No data to display  Initial Impression / Assessment and Plan / UC Course  I have reviewed the triage vital signs and the nursing notes.  Pertinent labs & imaging results that were available during my care of the patient were reviewed by me and considered in my medical decision making (see chart for details).     Dental infection  Pain, dental   Left upper molar fracture with surrounding infection. We will treat this with medication for pain and an antibiotic. Keep appointment with dentist as scheduled as further treatment will likely be needed. We will treat with the following: Augmentin  875 mg twice daily for 10 days.  This is an antibiotic.  Take this with food.  Hydrocodone /acetaminophen  5 mg 1-2 every 4 hours as needed for pain. Do not take products containing tylenol  while taking this medication.  Keep appointment with dentist as scheduled on the 13th.  Return to urgent care or PCP if symptoms worsen or fail to resolve.    Final Clinical Impressions(s) / UC Diagnoses   Final diagnoses:  Dental  infection  Pain, dental     Discharge Instructions      Left upper molar fracture with surrounding infection. We will treat this with medication for pain  and an antibiotic. Keep appointment with dentist as scheduled as further treatment will likely be needed. We will treat with the following: Augmentin  875 mg twice daily for 10 days.  This is an antibiotic.  Take this with food.  Hydrocodone /acetaminophen  5 mg 1-2 every 4 hours as needed for pain. Do not take products containing tylenol  while taking this medication.  Keep appointment with dentist as scheduled on the 13th.  Return to urgent care or PCP if symptoms worsen or fail to resolve.      ED Prescriptions     Medication Sig Dispense Auth. Provider   amoxicillin -clavulanate (AUGMENTIN ) 875-125 MG tablet Take 1 tablet by mouth every 12 (twelve) hours for 10 days. 20 tablet Lashann Hagg A, PA-C   HYDROcodone -acetaminophen  (NORCO/VICODIN) 5-325 MG tablet Take 1-2 tablets by mouth every 4 (four) hours as needed for severe pain (pain score 7-10). 15 tablet Teresa Almarie LABOR, [place]      I have reviewed the PDMP during this encounter.   Teresa Almarie LABOR, [place] 10/11/23 1244

## 2023-10-11 NOTE — Discharge Instructions (Addendum)
 Left upper molar fracture with surrounding infection. We will treat this with medication for pain and an antibiotic. Keep appointment with dentist as scheduled as further treatment will likely be needed. We will treat with the following: Augmentin  875 mg twice daily for 10 days.  This is an antibiotic.  Take this with food.  Hydrocodone /acetaminophen  5 mg 1-2 every 4 hours as needed for pain. Do not take products containing tylenol  while taking this medication.  Keep appointment with dentist as scheduled on the 13th.  Return to urgent care or PCP if symptoms worsen or fail to resolve.

## 2023-10-11 NOTE — ED Triage Notes (Signed)
 Patient is here with dental pain and swelling. Patient has dental appt coming up.

## 2024-02-04 ENCOUNTER — Ambulatory Visit (HOSPITAL_COMMUNITY)
Admission: EM | Admit: 2024-02-04 | Discharge: 2024-02-05 | Disposition: A | Discharge status: Home / Self Care | Attending: Psychiatry | Admitting: Psychiatry

## 2024-02-04 DIAGNOSIS — R456 Violent behavior: Secondary | ICD-10-CM | POA: Diagnosis not present

## 2024-02-04 DIAGNOSIS — F909 Attention-deficit hyperactivity disorder, unspecified type: Secondary | ICD-10-CM | POA: Insufficient documentation

## 2024-02-04 DIAGNOSIS — F17210 Nicotine dependence, cigarettes, uncomplicated: Secondary | ICD-10-CM | POA: Insufficient documentation

## 2024-02-04 DIAGNOSIS — Z635 Disruption of family by separation and divorce: Secondary | ICD-10-CM | POA: Diagnosis not present

## 2024-02-04 DIAGNOSIS — F1290 Cannabis use, unspecified, uncomplicated: Secondary | ICD-10-CM | POA: Diagnosis not present

## 2024-02-04 DIAGNOSIS — R4689 Other symptoms and signs involving appearance and behavior: Secondary | ICD-10-CM

## 2024-02-04 LAB — COMPREHENSIVE METABOLIC PANEL WITH GFR
ALT: 20 U/L (ref 0–44)
AST: 25 U/L (ref 15–41)
Albumin: 4.3 g/dL (ref 3.5–5.0)
Alkaline Phosphatase: 60 U/L (ref 38–126)
Anion gap: 14 (ref 5–15)
BUN: 13 mg/dL (ref 6–20)
CO2: 23 mmol/L (ref 22–32)
Calcium: 9.4 mg/dL (ref 8.9–10.3)
Chloride: 103 mmol/L (ref 98–111)
Creatinine, Ser: 1.07 mg/dL (ref 0.61–1.24)
GFR, Estimated: >60 mL/min (ref >60)
Glucose, Bld: 95 mg/dL (ref 70–99)
Potassium: 3.8 mmol/L (ref 3.5–5.1)
Sodium: 140 mmol/L (ref 135–145)
Total Bilirubin: 0.8 mg/dL (ref 0.0–1.2)
Total Protein: 7.9 g/dL (ref 6.5–8.1)

## 2024-02-04 LAB — CBC WITH DIFFERENTIAL/PLATELET
Abs Immature Granulocytes: 0.03 K/uL (ref 0.00–0.07)
Basophils Absolute: 0 K/uL (ref 0.0–0.1)
Basophils Relative: 0 %
Eosinophils Absolute: 0 K/uL (ref 0.0–0.5)
Eosinophils Relative: 0 %
HCT: 44.7 % (ref 39.0–52.0)
Hemoglobin: 14.9 g/dL (ref 13.0–17.0)
Immature Granulocytes: 0 %
Lymphocytes Relative: 14 %
Lymphs Abs: 1.6 K/uL (ref 0.7–4.0)
MCH: 28.8 pg (ref 26.0–34.0)
MCHC: 33.3 g/dL (ref 30.0–36.0)
MCV: 86.5 fL (ref 80.0–100.0)
Monocytes Absolute: 0.8 K/uL (ref 0.1–1.0)
Monocytes Relative: 7 %
Neutro Abs: 8.9 K/uL — ABNORMAL HIGH (ref 1.7–7.7)
Neutrophils Relative %: 79 %
Platelets: 217 K/uL (ref 150–400)
RBC: 5.17 MIL/uL (ref 4.22–5.81)
RDW: 12.9 % (ref 11.5–15.5)
WBC: 11.3 K/uL — ABNORMAL HIGH (ref 4.0–10.5)
nRBC: 0 % (ref 0.0–0.2)

## 2024-02-04 LAB — URINALYSIS, ROUTINE W REFLEX MICROSCOPIC
Bilirubin Urine: NEGATIVE
Glucose, UA: NEGATIVE mg/dL
Hgb urine dipstick: NEGATIVE
Ketones, ur: 20 mg/dL — AB
Leukocytes,Ua: NEGATIVE
Nitrite: NEGATIVE
Protein, ur: NEGATIVE mg/dL
Specific Gravity, Urine: 1.027 (ref 1.005–1.030)
pH: 5 (ref 5.0–8.0)

## 2024-02-04 LAB — POCT URINE DRUG SCREEN - MANUAL ENTRY (I-SCREEN)
POC Amphetamine UR: NOT DETECTED
POC Buprenorphine (BUP): NOT DETECTED
POC Cocaine UR: NOT DETECTED
POC Marijuana UR: POSITIVE — AB
POC Methadone UR: NOT DETECTED
POC Methamphetamine UR: NOT DETECTED
POC Morphine: NOT DETECTED
POC Oxazepam (BZO): NOT DETECTED
POC Oxycodone UR: NOT DETECTED
POC Secobarbital (BAR): NOT DETECTED

## 2024-02-04 MED ORDER — HALOPERIDOL LACTATE 5 MG/ML IJ SOLN: 10.0000 mg | Freq: Three times a day (TID) | INTRAMUSCULAR | Status: DC | PRN

## 2024-02-04 MED ORDER — MAGNESIUM HYDROXIDE 400 MG/5ML PO SUSP: 30.0000 mL | Freq: Every day | ORAL | Status: DC | PRN

## 2024-02-04 MED ORDER — DIPHENHYDRAMINE HCL 50 MG/ML IJ SOLN: 50.0000 mg | Freq: Three times a day (TID) | INTRAMUSCULAR | Status: DC | PRN

## 2024-02-04 MED ORDER — ACETAMINOPHEN 325 MG PO TABS: 650.0000 mg | ORAL_TABLET | Freq: Four times a day (QID) | ORAL | Status: DC | PRN

## 2024-02-04 MED ORDER — DIPHENHYDRAMINE HCL 50 MG PO CAPS
50.0000 mg | ORAL_CAPSULE | Freq: Three times a day (TID) | ORAL | Status: DC | PRN
Start: 2024-02-04 — End: 2024-02-05

## 2024-02-04 MED ORDER — HALOPERIDOL LACTATE 5 MG/ML IJ SOLN
5.0000 mg | Freq: Three times a day (TID) | INTRAMUSCULAR | Status: DC | PRN
Start: 2024-02-04 — End: 2024-02-05

## 2024-02-04 MED ORDER — LORAZEPAM 2 MG/ML IJ SOLN: 2.0000 mg | Freq: Three times a day (TID) | INTRAMUSCULAR | Status: DC | PRN

## 2024-02-04 MED ORDER — ALUM & MAG HYDROXIDE-SIMETH 200-200-20 MG/5ML PO SUSP: 30.0000 mL | ORAL | Status: DC | PRN

## 2024-02-04 MED ORDER — TRAZODONE HCL 50 MG PO TABS
50.0000 mg | ORAL_TABLET | Freq: Every evening | ORAL | Status: DC | PRN
  Administered 2024-02-04: 50 mg via ORAL
  Filled 2024-02-04: qty 1

## 2024-02-04 MED ORDER — LORAZEPAM 2 MG/ML IJ SOLN
2.0000 mg | Freq: Three times a day (TID) | INTRAMUSCULAR | Status: DC | PRN
Start: 2024-02-04 — End: 2024-02-05

## 2024-02-04 MED ORDER — HALOPERIDOL 5 MG PO TABS: 5.0000 mg | ORAL_TABLET | Freq: Three times a day (TID) | ORAL | Status: DC | PRN
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
## 2024-02-04 NOTE — ED Notes (Signed)
 Pt watching tv, denies SI/ HI/AVH. States  I made a stupid statement. He is pleasant and engaged. No noted distress. Will continue to monitor for safety.

## 2024-02-04 NOTE — Progress Notes (Signed)
   02/04/24 1043  [facility] Triage Screening (Walk-ins at [facility] only)  How Did You Hear About Us ? Legal System  What Is the Reason for Your Visit/Call Today? Ronnie Jenkins is a 53Y male presenting to [facility] vol via [employer]. [employer] reports pt is going through a breakup and wanted to talk to someone. Upon assessment pt appeared irritable stating we are wasting his time and he has places to be. Pt requested to leave during triage stating he gets mad when he has to wait. After explaining the process pt agreed to wait to be seen. Pt denies SI, HI, and AVH. Pt states he smoked weed in the last 24hrs.  How Long Has This Been Causing You Problems? <Week  Have You Recently Had Any Thoughts About Hurting Yourself? No  Are You Planning to Commit Suicide/Harm Yourself At This time? No  Have you Recently Had Thoughts About Hurting Someone Sherral? No  Are You Planning To Harm Someone At This Time? No  Physical Abuse Denies  Verbal Abuse Denies  Sexual Abuse Denies  Exploitation of patient/patient's resources Denies  Are you currently experiencing any auditory, visual or other hallucinations? No  Have You Used Any Alcohol or Drugs in the Past 24 Hours? Yes  What Did You Use and How Much? Weed  Do you have any current medical co-morbidities that require immediate attention? No  What Do You Feel Would Help You the Most Today? Treatment for Depression or other mood problem  Determination of Need Routine (7 days)  Options For Referral Outpatient Therapy;Medication Management

## 2024-02-04 NOTE — ED Notes (Signed)
 Patient is calm, cooperative and redirectable.Watching tv at this time. Safety measures cont.
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
## 2024-02-04 NOTE — ED Provider Notes (Signed)
 [facility]gent Care Continuous Assessment Admission H&P  Date: 02/04/24 Patient Name: Ronnie Jenkins MRN: [id number] Chief Complaint: I had an argument with my girlfriend   Diagnoses:  Final diagnoses:  Aggressive behavior, adult   Chart reviewed with attending psychiatrist, Dr Kandi Hahn.  HPI: Ronnie Jenkins is a 23Y male presenting to [facility] vol via [facility]. [employer] reports pt is going through a breakup and wanted to talk to someone. Upon assessment pt appeared irritable stating we are wasting his time and he has places to be. Pt requested to leave during triage stating he gets mad when he has to wait. After explaining the process pt agreed to wait to be seen. Pt denies SI, HI, and AVH. Pt states he smoked weed in the last 24hrs.   Pt is seen face-to-face on the [facility] Adult treatment area. Pt is alert & oriented x 4 and engages in today's visit. Today, pt states I had an argument with my girlfriend and we broke up. States I was ranting and raving on the phone and I was outside mad and my mom asked me to stop and I was frustrated and didn't listen. States the police were called and when they came they asked me if I wanted to come up here and talk to somebody. States he has a history of ADHD, not currently on medications. States he was on Vyvanse but discontinued due to it made me slow and a zombie). He denies prior inpatient psychiatric hospitalizations; denies current psychotropic medications; denies previous suicide attempts.  He denies suicidal or homicidal ideation, intent, or plan he denies AVH.  He endorses the use of 3 to 4 cigarettes/day and marijuana 2 blunts a day with last use yesterday.  He denies cocaine, heroin, fentanyl, or benzodiazepine use.  He gives verbal permission for this practitioner to speak with his mother.  Collateral information obtained from patient's mother, Bari Rung, who states I am tired of his behavior.  She states the patient woke up today and he wanted to  argue, for the past 3 days he has been in the ER mood.  States that she overheard the patient arguing with his girlfriend and he was irate on the phone.  States he grabbed my keys and stole my truck and verbalized that he would ram his car into his girlfriend's house.  Mother states when patient returned back home the dogs were barking and states the patient grabbed her smallest dog by the face aggressively and threw the dog across the room.  Mother states that the larger dog that was locked in a room continue to bark and that she had to block the door to keep the patient from going after her other dog.  Mother states patient has been talking about hurting the individual that killed his stepdad last year.  Mother states patient feels like people are plotting on him or talking about him and that he believes her youngest son who is 48.  Mother states patient has been endorsing suicidal thoughts stating I do not want to live no more, I wish I was dead, why did you even half me.  Mother states patient has told her that he thinks about dying in even when he is feeling okay.  Mother states that when patient found out that she had called the police that patient said we gonna have a shoot out and was wanting the police to shoot him.  Mother also shares that around 0950 this morning patient posted to his Facebook  page RIP see y'all soon, RIP DOA, and life was good.  Mother states that she does not feel safe with patient returning home due to his anger and aggression.  Pt revisited after receiving collateral information from mother. Pt admits that he did post the above to his [employer]. States I did it to get attention. States he girlfriend was ignoring him, however, after she saw the post. She immediately called him after she had been ignoring him. He adamantly denies suicidal or homicidal ideation, intent or plan. Discussed with patient that due his social media posts and collateral information obtained  from his mother, it is recommended that he remain overnight in continuous assessment to allow for de-escalation of this crisis. Pt hesitant to sign in voluntarily.. Voluntary and involuntary admission status discussed with pt. At this time, pt agrees to sign in voluntarily, however, IVC will be petitioned as clinically indicated.   Total Time spent with patient: 30 minutes  Musculoskeletal  Strength & Muscle Tone: within normal limits Gait & Station: normal Patient leans: N/A  Psychiatric Specialty Exam  Presentation General Appearance: Appropriate for Environment; Fairly Groomed  Eye Contact:Good  Speech:Clear and Coherent; Normal Rate  Speech Volume:Normal  Handedness:Right   Mood and Affect  Mood:Euthymic  Affect:Appropriate   Thought Process  Thought Processes:Coherent  Descriptions of Associations:Intact  Orientation:Full (Time, Place and Person)  Thought Content:Logical    Hallucinations:Hallucinations: None  Ideas of Reference:None  Suicidal Thoughts:Suicidal Thoughts: No  Homicidal Thoughts:Homicidal Thoughts: No   Sensorium  Memory:Immediate Fair; Recent Good  Judgment:Fair  Insight:Fair   Executive Functions  Concentration:Fair  Attention Span:Fair  Recall:Good  Fund of Knowledge:Good  Language:Good   Psychomotor Activity  Psychomotor Activity:Psychomotor Activity: Normal   Assets  Assets:Communication Skills; Desire for Improvement; Housing; Vocational/Educational   Sleep  Sleep:No data recorded  Nutritional Assessment (For OBS and FBC admissions only) Has the patient had a weight loss or gain of 10 pounds or more in the last 3 months?: No Has the patient had a decrease in food intake/or appetite?: No Does the patient have dental problems?: No Does the patient have eating habits or behaviors that may be indicators of an eating disorder including binging or inducing vomiting?: No Has the patient recently lost weight without  trying?: 0 Has the patient been eating poorly because of a decreased appetite?: 0 Malnutrition Screening Tool Score: 0    Physical Exam Vitals and nursing note reviewed.  HENT:     Head: Normocephalic.     Mouth/Throat:     Mouth: Mucous membranes are moist.  Cardiovascular:     Rate and Rhythm: Normal rate.  Pulmonary:     Effort: Pulmonary effort is normal.  Musculoskeletal:        General: Normal range of motion.     Cervical back: Normal range of motion.  Skin:    General: Skin is warm and dry.  Neurological:     Mental Status: He is alert and oriented to person, place, and time.  Psychiatric:     Comments: See HPI    Review of Systems  Constitutional:  Negative for chills and fever.  HENT:  Negative for congestion and sore throat.   Respiratory:  Negative for cough and shortness of breath.   Cardiovascular:  Negative for chest pain and palpitations.  Gastrointestinal:  Negative for diarrhea, nausea and vomiting.  Psychiatric/Behavioral:  Negative for hallucinations and suicidal ideas.     Blood pressure 125/80, pulse 64, temperature 98.4 F (36.9 C),  temperature source Oral, resp. rate 18, SpO2 98%. There is no height or weight on file to calculate BMI.  Past Psychiatric History: ADHD  Is the patient at risk to self? Yes  Has the patient been a risk to self in the past 6 months? unknown  Has the patient been a risk to self within the distant past? No   Is the patient a risk to others? Yes   Has the patient been a risk to others in the past 6 months? No   Has the patient been a risk to others within the distant past? No   Past Medical History: none reported  Family History: Brother: Autism  Social History: single, lives with mother and 36 yo brother. Works in aeronautical engineer.   Last Labs:  Admission on 02/04/2024  Component Date Value Ref Range Status   WBC 02/04/2024 11.3 (H)  4.0 - 10.5 K/uL Final   RBC 02/04/2024 5.17  4.22 - 5.81 MIL/uL Final    Hemoglobin 02/04/2024 14.9  13.0 - 17.0 g/dL Final   HCT 88/78/7974 44.7  39.0 - 52.0 % Final   MCV 02/04/2024 86.5  80.0 - 100.0 fL Final   MCH 02/04/2024 28.8  26.0 - 34.0 pg Final   MCHC 02/04/2024 33.3  30.0 - 36.0 g/dL Final   RDW 88/78/7974 12.9  11.5 - 15.5 % Final   Platelets 02/04/2024 217  150 - 400 K/uL Final   nRBC 02/04/2024 0.0  0.0 - 0.2 % Final   Neutrophils Relative % 02/04/2024 79  % Final   Neutro Abs 02/04/2024 8.9 (H)  1.7 - 7.7 K/uL Final   Lymphocytes Relative 02/04/2024 14  % Final   Lymphs Abs 02/04/2024 1.6  0.7 - 4.0 K/uL Final   Monocytes Relative 02/04/2024 7  % Final   Monocytes Absolute 02/04/2024 0.8  0.1 - 1.0 K/uL Final   Eosinophils Relative 02/04/2024 0  % Final   Eosinophils Absolute 02/04/2024 0.0  0.0 - 0.5 K/uL Final   Basophils Relative 02/04/2024 0  % Final   Basophils Absolute 02/04/2024 0.0  0.0 - 0.1 K/uL Final   Immature Granulocytes 02/04/2024 0  % Final   Abs Immature Granulocytes 02/04/2024 0.03  0.00 - 0.07 K/uL Final   Performed at [facility], [address] [place], [place] [address]   Sodium 02/04/2024 140  135 - 145 mmol/L Final   Potassium 02/04/2024 3.8  3.5 - 5.1 mmol/L Final   Chloride 02/04/2024 103  98 - 111 mmol/L Final   CO2 02/04/2024 23  22 - 32 mmol/L Final   Glucose, Bld 02/04/2024 95  70 - 99 mg/dL Final   Glucose reference range applies only to samples taken after fasting for at least 8 hours.   BUN 02/04/2024 13  6 - 20 mg/dL Final   Creatinine, Ser 02/04/2024 1.07  0.61 - 1.24 mg/dL Final   Calcium 88/78/7974 9.4  8.9 - 10.3 mg/dL Final   Total Protein 88/78/7974 7.9  6.5 - 8.1 g/dL Final   Albumin 88/78/7974 4.3  3.5 - 5.0 g/dL Final   AST 88/78/7974 25  15 - 41 U/L Final   ALT 02/04/2024 20  0 - 44 U/L Final   Alkaline Phosphatase 02/04/2024 60  38 - 126 U/L Final   Total Bilirubin 02/04/2024 0.8  0.0 - 1.2 mg/dL Final   GFR, Estimated 02/04/2024 >60  >60 mL/min Final   Comment: (NOTE) Calculated  using the CKD-EPI Creatinine Equation (2021)  Anion gap 02/04/2024 14  5 - 15 Final   Performed at [facility], [address], [place], [place] [address]  Admission on 09/19/2023, Discharged on 09/19/2023  Component Date Value Ref Range Status   Lactic Acid, Venous 09/19/2023 0.8  0.5 - 1.9 mmol/L Final    Allergies: Patient has no known allergies.  Medications:  Facility Ordered Medications  Medication   acetaminophen  (TYLENOL ) tablet 650 mg   alum & mag hydroxide-simeth (MAALOX/MYLANTA) 200-200-20 MG/5ML suspension 30 mL   magnesium  hydroxide (MILK OF MAGNESIA) suspension 30 mL   haloperidol  (HALDOL ) tablet 5 mg   And   diphenhydrAMINE  (BENADRYL ) capsule 50 mg   haloperidol  lactate (HALDOL ) injection 5 mg   And   diphenhydrAMINE  (BENADRYL ) injection 50 mg   And   LORazepam  (ATIVAN ) injection 2 mg   haloperidol  lactate (HALDOL ) injection 10 mg   And   diphenhydrAMINE  (BENADRYL ) injection 50 mg   And   LORazepam  (ATIVAN ) injection 2 mg      Medical Decision Making  Lab Orders         CBC with Differential/Platelet         Comprehensive metabolic panel         Urinalysis, Routine w reflex microscopic -Urine, Clean Catch         POCT Urine Drug Screen - (I-Screen)     EKG - QTc 372    Recommendations  Based on my evaluation the patient does not appear to have an emergency medical condition.  Pt will be admitted to the Adult OBS unit over night for continuous supervision and assistance with the de-escalation of the crisis and to determine the clinically appropriate level of care   Sherrell Culver, PMHNP-BC, FNP-BC  02/04/24  2:45 PM

## 2024-02-05 ENCOUNTER — Encounter (HOSPITAL_COMMUNITY): Payer: Self-pay | Admitting: Emergency Medicine

## 2024-02-05 NOTE — ED Notes (Signed)
 Patient resting in recliner with eyes closed. Breathing even and unlabored with even rise and fall of chest. No s/s of current distress.

## 2024-02-05 NOTE — ED Provider Notes (Addendum)
 FBC/OBS ASAP Discharge Summary  Date and Time: 02/05/2024 8:52 AM  Name: Ronnie Jenkins  MRN:  [id number]   Discharge Diagnoses:  Final diagnoses:  Aggressive behavior, adult   Chart reviewed with attending psychiatrist, Dr Kandi Hahn.   Subjective: I'm alright   Stay Summary: Per triage on 02/04/2024, pt presented to [facility] vol via [facility]. [employer] reported pt is going through a breakup and wanted to talk to someone. Upon assessment pt appeared irritable stating we are wasting his time and he has places to be. Pt requested to leave during triage stating he gets mad when he has to wait. After explaining the process pt agreed to wait to be seen. Pt denies SI, HI, and AVH. Pt states he smoked weed in the last 24hrs.   Pt had got into an argument with his girlfriend and was ranting and raving and didn't cease when asked by his mom resulting in his mom calling the police to escalate the situation. Patient was brought the [facility] to talk to someone. Mother came to the facility and collateral information was obtained. Mother reported patient had taken the keys to her vehicle and stated he would ram the car into his girlfriend's home. Pt states he did go to the GF's home to get my clothes but did not attempt to ram the home. Pt denies wanting to die. He admits to posting on Facebook RIP see y'all soon, RIP DOA, and Life Was Good. States his sole purpose for posting this, I did it to get attention and it was stupid. Due to collateral information obtained from mother and patient's social media posts pt was voluntarily admitted to the adult observation unit for continuous assessment to allow for de-escalation of the crisis. He remained calm and cooperative overnight on the unit. States he has spoken to both his mother and his girlfriend and has apologized to both. States he recognizes he needs to step away from situations when he is becoming upset to allow himself to decompress. He is appreciative of his stay  on the unit to allow him to reflect on his actions and states he recognizes this was stupid of me. He denies suicidal or homicidal ideation, intent, or plan. He denies AVH. On assessment this morning, there is no evidence of psychosis/mania, delusional thinking, or significant mental health impairment.  He is able to converse coherently, with goal directed thoughts, and no distractibility, or pre-occupation. He denies access to weapons in the home he shares with his mother. He states his girlfriend will be picking him up this morning and they plan to go out to breakfast. He has responded appropriately to questions and has remained calm and cooperative throughout assessment. While future psychiatric events cannot be accurately predicted, the patient does not currently require acute inpatient psychiatric care and does not currently meet   involuntary commitment criteria. Patient is able to engage in safety planning including plan to return to [facility], contact emergency services or go to the closest emergency department if he feels unable to maintain his own safety or the safety of others.   Total Time spent with patient: 10 mins  Past Psychiatric History: ADHD Past Medical History: none reported  Family History: none reported Family Psychiatric History: Brother: Autism Social History: single, lives with mother and 59 yo brother. No children. No weapons in the home. Works in aeronautical engineer.  Tobacco Cessation:  A prescription for an FDA-approved tobacco cessation medication was offered at discharge and the patient refused  Current Medications:  Current Facility-Administered Medications  Medication Dose Route Frequency Provider Last Rate Last Admin   acetaminophen  (TYLENOL ) tablet 650 mg  650 mg Oral Q6H PRN Deshia Vanderhoof E, NP       alum & mag hydroxide-simeth (MAALOX/MYLANTA) 200-200-20 MG/5ML suspension 30 mL  30 mL Oral Q4H PRN Rilla Buckman E, NP       haloperidol  (HALDOL ) tablet 5 mg  5 mg  Oral TID PRN Markiah Janeway E, NP       And   diphenhydrAMINE  (BENADRYL ) capsule 50 mg  50 mg Oral TID PRN Zackaria Burkey E, NP       haloperidol  lactate (HALDOL ) injection 5 mg  5 mg Intramuscular TID PRN Zane Samson E, NP       And   diphenhydrAMINE  (BENADRYL ) injection 50 mg  50 mg Intramuscular TID PRN Shepard Keltz E, NP       And   LORazepam  (ATIVAN ) injection 2 mg  2 mg Intramuscular TID PRN Hiyab Nhem E, NP       haloperidol  lactate (HALDOL ) injection 10 mg  10 mg Intramuscular TID PRN Brecken Walth E, NP       And   diphenhydrAMINE  (BENADRYL ) injection 50 mg  50 mg Intramuscular TID PRN Raiza Kiesel E, NP       And   LORazepam  (ATIVAN ) injection 2 mg  2 mg Intramuscular TID PRN Tiyah Zelenak E, NP       magnesium  hydroxide (MILK OF MAGNESIA) suspension 30 mL  30 mL Oral Daily PRN Donnesha Karg E, NP       traZODone  (DESYREL ) tablet 50 mg  50 mg Oral QHS PRN Bobbitt, Shalon E, NP   50 mg at 02/04/24 2139   No current outpatient medications on file.    PTA Medications:  Facility Ordered Medications  Medication   acetaminophen  (TYLENOL ) tablet 650 mg   alum & mag hydroxide-simeth (MAALOX/MYLANTA) 200-200-20 MG/5ML suspension 30 mL   magnesium  hydroxide (MILK OF MAGNESIA) suspension 30 mL   haloperidol  (HALDOL ) tablet 5 mg   And   diphenhydrAMINE  (BENADRYL ) capsule 50 mg   haloperidol  lactate (HALDOL ) injection 5 mg   And   diphenhydrAMINE  (BENADRYL ) injection 50 mg   And   LORazepam  (ATIVAN ) injection 2 mg   haloperidol  lactate (HALDOL ) injection 10 mg   And   diphenhydrAMINE  (BENADRYL ) injection 50 mg   And   LORazepam  (ATIVAN ) injection 2 mg   traZODone  (DESYREL ) tablet 50 mg       11/06/2018    4:01 PM 11/02/2018    9:56 PM 09/16/2018   12:58 PM  Depression screen PHQ 2/9  Decreased Interest 0 0 0  Down, Depressed, Hopeless 0 0 0  PHQ - 2 Score 0 0 0  Altered sleeping 0 0 0  Tired, decreased energy 0 0 0  Change in appetite 0 0 0  Feeling bad or failure about  yourself  0 0 0  Trouble concentrating 0 0 0  Moving slowly or fidgety/restless 0 0 0  Suicidal thoughts 0 0 0  PHQ-9 Score 0  0  0      Data saved with a previous flowsheet row definition    Flowsheet Row ED from 02/04/2024 in [facility] from 10/11/2023 in [facility] at [facility] ED from 09/19/2023 in [facility] Emergency Department at [facility] [place]  C-SSRS RISK CATEGORY No Risk No Risk No Risk    Musculoskeletal  Strength & Muscle Tone: within  normal limits Gait & Station: normal Patient leans: N/A  Psychiatric Specialty Exam  Presentation  General Appearance:  Appropriate for Environment; Fairly Groomed  Eye Contact: Good  Speech: Clear and Coherent; Normal Rate  Speech Volume: Normal  Handedness: Right   Mood and Affect  Mood: Euthymic  Affect: Congruent   Thought Process  Thought Processes: Coherent  Descriptions of Associations:Intact  Orientation:Full (Time, Place and Person)  Thought Content:Logical  Diagnosis of Schizophrenia or Schizoaffective disorder in past: No    Hallucinations:Hallucinations: None  Ideas of Reference:None  Suicidal Thoughts:Suicidal Thoughts: No  Homicidal Thoughts:Homicidal Thoughts: No   Sensorium  Memory: Recent Good; Immediate Good  Judgment: Good  Insight: Good   Executive Functions  Concentration: Good  Attention Span: Good  Recall: Good  Fund of Knowledge: Good  Language: Good   Psychomotor Activity  Psychomotor Activity: Psychomotor Activity: Normal   Assets  Assets: Communication Skills; Desire for Improvement; Housing; Vocational/Educational; Resilience   Sleep  Sleep:Sleep: Good  No Safety Checks orders active in given range  Nutritional Assessment (For OBS and FBC admissions only) Has the patient had a weight loss or gain of 10 pounds or more in the last 3 months?: No Has the patient had a decrease in food intake/or  appetite?: No Does the patient have dental problems?: No Does the patient have eating habits or behaviors that may be indicators of an eating disorder including binging or inducing vomiting?: No Has the patient recently lost weight without trying?: 0 Has the patient been eating poorly because of a decreased appetite?: 0 Malnutrition Screening Tool Score: 0    Physical Exam  Physical Exam Vitals and nursing note reviewed.  Constitutional:      Appearance: Normal appearance.  HENT:     Mouth/Throat:     Mouth: Mucous membranes are moist.  Cardiovascular:     Rate and Rhythm: Normal rate.  Pulmonary:     Effort: Pulmonary effort is normal.  Musculoskeletal:        General: Normal range of motion.     Cervical back: Normal range of motion.  Skin:    General: Skin is warm and dry.  Neurological:     Mental Status: He is alert and oriented to person, place, and time.  Psychiatric:     Comments: See HPI    Review of Systems  Constitutional:  Negative for chills and fever.  HENT:  Negative for congestion and sore throat.   Respiratory:  Negative for cough and shortness of breath.   Cardiovascular:  Negative for chest pain and palpitations.  Psychiatric/Behavioral:  Negative for depression, hallucinations and suicidal ideas. The patient is not nervous/anxious.    Blood pressure 129/81, pulse 85, temperature 98.4 F (36.9 C), temperature source Oral, resp. rate 18, SpO2 100%. There is no height or weight on file to calculate BMI.  Demographic Factors:  Male  Loss Factors: NA  Historical Factors: Impulsivity  Risk Reduction Factors:   Employed and Living with another person, especially a relative  Continued Clinical Symptoms:  N/A  Cognitive Features That Contribute To Risk:  None    Suicide Risk:  Minimal: No identifiable suicidal ideation.  Patients presenting with no risk factors but with morbid ruminations; may be classified as minimal risk based on the severity  of the depressive symptoms  Plan Of Care/Follow-up recommendations:  Activity:  as tolerated Diet:  regular Tests:  as determined by outpatient primary care or mental health providers  Disposition: Discharge home with outpatient resources  for outpatient counseling  Sherrell Culver, PMHNP-BC, FNP-BC  02/05/2024, 8:52 AM

## 2024-02-05 NOTE — ED Notes (Signed)
 Patient Is discharging at this time. Printed AVS reviewed with patient along with resources. Patient denies SI, HI, and A/V/H. Valuables/belongings returned to patient. Patient is being transported home by his girlfriend. No s/s of current distress.

## 2024-02-05 NOTE — Discharge Instructions (Addendum)
 Discharge Recommendations:  Outpatient Follow up: Please review list of outpatient resources for psychiatry and counseling. Please follow up with your primary care provider for all medical related needs.   Therapy: We recommend that patient participate in individual therapy to address stressors.   [facility] [address], [place], [address] [phone] phone  New Patient Assessment/Therapy Walk-ins Monday and Wednesday: 8am until slots are full. Every 1st and 2nd Friday: 1pm - 5pm  NO ASSESSMENT/THERAPY WALK-INS ON TUESDAYS OR THURSDAYS  New Patient Psychiatry/Medication Management Walk-ins Monday-Friday: 8am-11am  For all walk-ins, we ask that you arrive by 7:30am because patient will be seen in the order of arrival.  Availability is limited; therefore, you may not be seen on the same day that you walk-in.  Our goal is to serve and meet the needs of our community to the best of our ability.   [facility] [address] [place], [place], [address] [phone] phone  [facility] [address] [place], [place], [address] [phone] phone [phone] phone ([address], [address], Anthem/Elevance, [address], [place], [address], [address], Healthy [place], [place], [place], [address], [employer], Jovista, UHC, [employer], [place], Out of Network)  [employer], [place] [address] [place], [place], [address] [phone] phone ([place], Anthem/Elevance, Sanmina-[employer] Options/Carelon, BCBS, [address], [place], [place], [place], [place], [employer], [place], [place], [place], [facility])  [employer] [facility] [address] [place], [place], [address] [phone] phone (Medicaid, ask about other insurance)  [facility] [address], [address] [place], [place], [address] [phone] phone [phone] fax ([address] [place] , [place],  [place], [place] Health Choice, UHC, GENERAL ELECTRIC, Self-Pay)  Sarah Lempka [address] [place], [place], [address] [phone] phone ([address] Anthem/Elevance, [address], [address], [place], [employer], [place], [place], [place], [employer], [place], [place], [place], [facility])  [facility] - 6-8 MONTH WAIT FOR THERAPY; [facility] [address] [place], [place], [address] [phone] phone ([address] [employer] [address] - [place], [address], [place], [place], Friday Health Plans, [address], BCBS Healthy [place], [place], [address], [place], [place], [place], [place], Tricare, UHC, [employer], [place])  [facility] [address], [address] [place], [place], [address] [phone] phone  [facility] [address], [address] [place], [place], [address] [phone] phone  [facility] [address] [place], [place], [address] [phone] phone  [facility] - THERAPY ONLY [address]  [place], [place], [address] [phone] phone  [facility], [place] [address] [place], [place], [address] [phone] phone  [facility] [address] [place], [place], [address] [phone] phone [phone] fax  [facility] [address] [place], [place], [address] [phone] phone [phone] fax  [facility] [phone] [address] [place], [place], [address] [phone] phone  Safety:   The following safety precautions should be taken:   No sharp objects. This includes scissors, razors, scrapers, and putty knives.   Chemicals should be removed and locked up.   Medications should be removed and locked up.   Weapons should be removed and locked up. This includes firearms, knives and instruments that can be used to cause injury.   The patient should abstain from use of illicit  substances/drugs and abuse of any medications.  If symptoms worsen or do not continue to improve or if the patient becomes actively suicidal or homicidal  then it is recommended that the patient return to the closest hospital emergency department, the [facility], or call 911 for further evaluation and treatment.  National Suicide Prevention Lifeline 1-800-SUICIDE or 910-862-0764.  About 988 988 offers 24/7 access to trained crisis counselors who can help people experiencing mental health-related distress. People can call or text 988 or chat 988lifeline.org for themselves or if they are worried about a loved one who may need crisis support.

## 2024-02-05 NOTE — ED Notes (Signed)
 Pt observed/assessed in recliner sleeping. RR even and unlabored, appearing in no noted distress. Environmental check complete, will continue to monitor for safety
